# Patient Record
Sex: Male | Born: 1970 | Race: White | Hispanic: No | Marital: Married | Smoking: Never smoker
Health system: Southern US, Community
[De-identification: ages and names within clinical notes are randomized; demographics above are authoritative.]

## PROBLEM LIST (undated history)

## (undated) DIAGNOSIS — E119 Type 2 diabetes mellitus without complications: Secondary | ICD-10-CM

## (undated) DIAGNOSIS — G932 Benign intracranial hypertension: Secondary | ICD-10-CM

## (undated) DIAGNOSIS — Z87442 Personal history of urinary calculi: Secondary | ICD-10-CM

## (undated) DIAGNOSIS — I10 Essential (primary) hypertension: Secondary | ICD-10-CM

## (undated) DIAGNOSIS — G4730 Sleep apnea, unspecified: Secondary | ICD-10-CM

## (undated) DIAGNOSIS — M1990 Unspecified osteoarthritis, unspecified site: Secondary | ICD-10-CM

## (undated) DIAGNOSIS — Z8639 Personal history of other endocrine, nutritional and metabolic disease: Secondary | ICD-10-CM

## (undated) HISTORY — PX: CERVICAL SPINE SURGERY: SHX589

## (undated) HISTORY — PX: CSF SHUNT: SHX92

## (undated) HISTORY — PX: KNEE SURGERY: SHX244

## (undated) HISTORY — PX: LOOP RECORDER IMPLANT: SHX5954

---

## 1997-07-14 ENCOUNTER — Emergency Department (HOSPITAL_COMMUNITY): Admission: EM | Admit: 1997-07-14 | Discharge: 1997-07-14 | Payer: Self-pay | Admitting: Emergency Medicine

## 1998-02-12 ENCOUNTER — Ambulatory Visit (HOSPITAL_COMMUNITY): Admission: RE | Admit: 1998-02-12 | Discharge: 1998-02-12 | Payer: Self-pay | Admitting: Family Medicine

## 1998-02-12 ENCOUNTER — Encounter: Payer: Self-pay | Admitting: Family Medicine

## 1998-02-26 ENCOUNTER — Encounter: Payer: Self-pay | Admitting: Neurosurgery

## 1998-03-02 ENCOUNTER — Inpatient Hospital Stay (HOSPITAL_COMMUNITY): Admission: RE | Admit: 1998-03-02 | Discharge: 1998-03-03 | Payer: Self-pay | Admitting: Neurosurgery

## 1998-03-02 ENCOUNTER — Encounter: Payer: Self-pay | Admitting: Neurosurgery

## 1998-07-04 ENCOUNTER — Emergency Department (HOSPITAL_COMMUNITY): Admission: EM | Admit: 1998-07-04 | Discharge: 1998-07-04 | Payer: Self-pay | Admitting: Emergency Medicine

## 2001-09-05 ENCOUNTER — Encounter: Admission: RE | Admit: 2001-09-05 | Discharge: 2001-09-05 | Payer: Self-pay | Admitting: Family Medicine

## 2001-09-05 ENCOUNTER — Encounter: Payer: Self-pay | Admitting: Family Medicine

## 2003-11-12 ENCOUNTER — Emergency Department (HOSPITAL_COMMUNITY): Admission: EM | Admit: 2003-11-12 | Discharge: 2003-11-12 | Payer: Self-pay | Admitting: Emergency Medicine

## 2003-12-15 ENCOUNTER — Ambulatory Visit (HOSPITAL_COMMUNITY): Admission: RE | Admit: 2003-12-15 | Discharge: 2003-12-15 | Payer: Self-pay | Admitting: Family Medicine

## 2004-05-31 ENCOUNTER — Emergency Department (HOSPITAL_COMMUNITY): Admission: EM | Admit: 2004-05-31 | Discharge: 2004-05-31 | Payer: Self-pay | Admitting: Emergency Medicine

## 2004-06-07 ENCOUNTER — Ambulatory Visit: Admission: RE | Admit: 2004-06-07 | Discharge: 2004-06-07 | Payer: Self-pay | Admitting: Family Medicine

## 2004-07-01 ENCOUNTER — Encounter: Admission: RE | Admit: 2004-07-01 | Discharge: 2004-07-01 | Payer: Self-pay | Admitting: Neurology

## 2004-07-06 ENCOUNTER — Emergency Department (HOSPITAL_COMMUNITY): Admission: EM | Admit: 2004-07-06 | Discharge: 2004-07-07 | Payer: Self-pay | Admitting: Emergency Medicine

## 2005-08-14 ENCOUNTER — Encounter (INDEPENDENT_AMBULATORY_CARE_PROVIDER_SITE_OTHER): Payer: Self-pay | Admitting: Cardiology

## 2005-08-14 ENCOUNTER — Inpatient Hospital Stay (HOSPITAL_COMMUNITY): Admission: EM | Admit: 2005-08-14 | Discharge: 2005-08-18 | Payer: Self-pay | Admitting: Emergency Medicine

## 2005-09-14 ENCOUNTER — Encounter: Admission: RE | Admit: 2005-09-14 | Discharge: 2005-09-14 | Payer: Self-pay | Admitting: Gastroenterology

## 2005-09-19 ENCOUNTER — Encounter: Admission: RE | Admit: 2005-09-19 | Discharge: 2005-09-19 | Payer: Self-pay | Admitting: Gastroenterology

## 2005-11-02 ENCOUNTER — Ambulatory Visit (HOSPITAL_COMMUNITY): Admission: RE | Admit: 2005-11-02 | Discharge: 2005-11-02 | Payer: Self-pay | Admitting: Gastroenterology

## 2005-12-11 ENCOUNTER — Inpatient Hospital Stay (HOSPITAL_COMMUNITY): Admission: AD | Admit: 2005-12-11 | Discharge: 2005-12-14 | Payer: Self-pay | Admitting: *Deleted

## 2006-01-03 ENCOUNTER — Ambulatory Visit (HOSPITAL_COMMUNITY): Admission: RE | Admit: 2006-01-03 | Discharge: 2006-01-03 | Payer: Self-pay | Admitting: Neurology

## 2006-01-29 ENCOUNTER — Ambulatory Visit: Payer: Self-pay | Admitting: Endocrinology

## 2006-02-19 ENCOUNTER — Ambulatory Visit: Payer: Self-pay | Admitting: Endocrinology

## 2006-03-13 ENCOUNTER — Encounter: Admission: RE | Admit: 2006-03-13 | Discharge: 2006-03-13 | Payer: Self-pay | Admitting: Neurology

## 2006-03-26 ENCOUNTER — Encounter: Admission: RE | Admit: 2006-03-26 | Discharge: 2006-03-26 | Payer: Self-pay | Admitting: Neurology

## 2006-04-13 ENCOUNTER — Ambulatory Visit: Payer: Self-pay | Admitting: Endocrinology

## 2006-06-12 ENCOUNTER — Emergency Department (HOSPITAL_COMMUNITY): Admission: EM | Admit: 2006-06-12 | Discharge: 2006-06-12 | Payer: Self-pay | Admitting: Emergency Medicine

## 2006-06-20 ENCOUNTER — Ambulatory Visit (HOSPITAL_COMMUNITY): Admission: RE | Admit: 2006-06-20 | Discharge: 2006-06-20 | Payer: Self-pay | Admitting: Neurology

## 2006-11-03 ENCOUNTER — Encounter: Payer: Self-pay | Admitting: Endocrinology

## 2006-11-03 DIAGNOSIS — F3289 Other specified depressive episodes: Secondary | ICD-10-CM | POA: Insufficient documentation

## 2006-11-03 DIAGNOSIS — I10 Essential (primary) hypertension: Secondary | ICD-10-CM | POA: Insufficient documentation

## 2006-11-03 DIAGNOSIS — F329 Major depressive disorder, single episode, unspecified: Secondary | ICD-10-CM

## 2006-12-18 ENCOUNTER — Inpatient Hospital Stay (HOSPITAL_COMMUNITY): Admission: RE | Admit: 2006-12-18 | Discharge: 2006-12-20 | Payer: Self-pay | Admitting: Psychiatry

## 2006-12-18 ENCOUNTER — Emergency Department (HOSPITAL_COMMUNITY): Admission: EM | Admit: 2006-12-18 | Discharge: 2006-12-18 | Payer: Self-pay | Admitting: Emergency Medicine

## 2006-12-18 ENCOUNTER — Ambulatory Visit: Payer: Self-pay | Admitting: Psychiatry

## 2007-08-05 ENCOUNTER — Emergency Department (HOSPITAL_COMMUNITY): Admission: EM | Admit: 2007-08-05 | Discharge: 2007-08-05 | Payer: Self-pay | Admitting: Emergency Medicine

## 2007-10-22 ENCOUNTER — Emergency Department (HOSPITAL_BASED_OUTPATIENT_CLINIC_OR_DEPARTMENT_OTHER): Admission: EM | Admit: 2007-10-22 | Discharge: 2007-10-22 | Payer: Self-pay | Admitting: Emergency Medicine

## 2008-01-01 ENCOUNTER — Encounter: Admission: RE | Admit: 2008-01-01 | Discharge: 2008-01-01 | Payer: Self-pay | Admitting: Neurology

## 2008-02-04 ENCOUNTER — Encounter: Admission: RE | Admit: 2008-02-04 | Discharge: 2008-02-04 | Payer: Self-pay | Admitting: Neurology

## 2010-04-17 ENCOUNTER — Encounter: Payer: Self-pay | Admitting: Neurology

## 2010-08-09 NOTE — Discharge Summary (Signed)
NAME:  [name] [name], [name] [name]                 ACCOUNT NO.:  [id number]   MEDICAL RECORD NO.:  [id number]          PATIENT TYPE:  IPS   LOCATION:  0505                          FACILITY:  [facility]   PHYSICIAN:  Geoffery Lyons, M.D.      DATE OF BIRTH:  March 24, 1971   DATE OF ADMISSION:  12/18/2006  DATE OF DISCHARGE:  12/20/2006                               DISCHARGE SUMMARY   CHIEF COMPLAINT:  This was the first admission to [facility] for this 40 year old married white male voluntarily admitted.  History of substance use using opiates and benzodiazepines, taking 2  Xanax 3 times a day and opiates for a year.  Tried to elope.  He has  meds from PCP to help with detox.  Did not feel like he needed to be  here.   PAST MEDICAL HISTORY:  1. First time at [facility] and had been at [facility] before.  2. Clinical history as already stated, abusing opiates and      benzodiazepines.  3. Headaches.  4. Seizures.  5. Hypertension.   MEDICATIONS:  1. Tricor 145 mg per day.  2. Lotrel 10-40 one daily.  3. Celexa 40 mg per day.  4. Simvastatin 40 mg per day.  5. Bystolic 10 mg per day.  6. Lortab 2 tablets 3 times a day.  7. Adderall 30 mg twice a day.   Physical examination was performed and failed to show any acute  findings.   LABORATORY WORK:  Drug screen positive for benzodiazepines.  Other  results not available.   MENTAL STATUS EXAM:  Revealed a male, well-nourished, well-developed,  alert cooperative.  Good eye contact.  Speech clear, normal rate, tempo  and production.  Mood anxious.  Affect constricted.  Processes logical,  coherent and relevant.  No evidence of delusions.  No active suicidal or  homicidal ideas. No hallucinations.  Cognition well-preserved.   AXIS I: Opiate abuse, rule out dependence.  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 Rule out mood disorder, NOS.  AXIS II:  No diagnosis.  AXIS III:  Hypertension, seizure disorder.  AXIS IV: Moderate.  AXIS V:  On admission, 40 GAF in the last year 65.   COURSE IN THE HOSPITAL:  He was admitted.  He was started in individual  and group psychotherapy.  He was detoxified with clonidine and Librium.  He was given trazodone for sleep.  He was given some Seroquel.  Claimed  that he had a real bad headache.  Seizures to pain medications.  Says  that the dose that he was given did not work.  He had he took more of  it, took his months prescription in a week.  Then he has to get some  from the street.  Seen a neurologist in [facility] for his headaches.  Had been on Depakote, Topamax, Neurontin.  Working at [employer], and has been on disability on and off for a year.  Has suffered  depression before and was separated for a year.  He did not consider to  have a  problem.   He left the ED yesterday.  The evening before this admission, he was  threatened that DSS was going to be notified as he was driving the  children to school under the influence.  After he eloped, wife brought  him back and he has voluntary admission.  He had been in [facility]  before.  Initially, he apparently was calling his wife, stating that he  was not going to stay because he wanted pain medications.  The wife did  not want him back unless he was fully detoxed.  He endorsed that he did  not want to go through cold [place].  That first evening, he became  increasingly belligerent and demanding pain medications, and given  Seroquel. Was able to run through door to the hall when the staff coming  through.  He was talked into coming back.  He was pacing.  Focused that  he wanted to get out of the hospital.  He required a lot of staff  intervention to de-escalate.  He did decide 72 hours for discharge, but  he had a whole lot different attitude.  He was pretty sedated with the  Seroquel which allowed some of the detox to happen.  He still did not  want to comply.   He was willing to take Depakote.  We worked on  optimizing treatment with  the Depakote.  A lot of the mood fluctuation was secondary to his use.  On December 20, 2006, he was in full contact with reality.  There were  no active suicidal ideas, no hallucinations or delusions.  Claimed that  he was not going to go back to use drugs, opiates.  Notes that he came  back to break up the cycle.  We will allow the physician to reassess his  pain management.  He is going to have less availability as the wife was  going to take the pills, so we went ahead and discharged to outpatient  followup.   AXIS I:  Opiate benzodiazepine dependence.  Mood disorder, NOS.  AXIS II: No diagnosis.  AXIS III:  Hypertension, seizure disorder, migraine headache.  AXIS IV:  Moderate.  AXIS V:  On discharge 50.   DISCHARGE MEDICATIONS:  1. Lotrel 10-40 one in the morning.  2. Tricor 145 mg per day.  3. Celexa 40 mg in the morning.  4. Simvastatin 40 mg per day.  5. Bystolic 10 mg 1 at bedtime.  6. Depakote 250 twice a day.   FOLLOW UP:  Follow up with his neurologist, and [name] [name].      Geoffery Lyons, M.D.  Electronically Signed     IL/MEDQ  D:  01/09/2007  T:  01/10/2007  Job:  [id number]

## 2010-08-12 NOTE — Consult Note (Signed)
[facility]                            ENDOCRINOLOGY CONSULTATION   NAME:[name] [name], [name] [name]                        MRN:          [id number]  DATE:01/29/2006                            DOB:          1970/08/01    REFERRING PHYSICIAN:  Melida Quitter, M.D.   REASON FOR REFERRAL:  Hypogonadism.   HISTORY OF PRESENT ILLNESS:  A 40 year old man with 2 years of fatigue.  He  has associated erectile dysfunction as well as weakness of the muscles of  all four extremities.  He also has chronic headache which he states he has  been told is due to a spinal origin.  He is being scheduled to see a  neurologist at [facility] for this.  He was prescribed AndroGel 10 g daily  without any improvement in his symptoms nor in his laboratory studies.   PAST MEDICAL HISTORY:  1. Hypertension.  2. Dyslipidemia.  3. ADHD.  4. Depression.   SOCIAL HISTORY:  He is married.  He works [employer].  He  has two children, one of whom is adopted.  The adopted child is 71 years old  and his biological child is 9 years old.   REVIEW OF SYSTEMS:  He has gained 30 pounds in the past 2 years.  He denies  any difficulty starting or stopping the urinary stream.   PHYSICAL EXAMINATION:  VITAL SIGNS:  Blood pressure 140/92, heart rate is  86, temperature is 97.2, the weight is 206, height is 5 feet 6 inches.  GENERAL:  No distress.  SKIN:  Normal hair distribution.  HEENT:  No proptosis, no periorbital swelling.  NECK:  No goiter.  CHEST:  Clear to auscultation.  There is no gynecomastia, no respiratory  distress.  CARDIOVASCULAR:  No JVD, no edema.  Regular rate and rhythm, no murmur.  Pedal pulses are intact.  GENITALIA:  Normal except that the testes are both small and soft.   LABORATORY STUDIES FORWARDED BY DR. HARRIS:  On December 15, 2005,  testosterone 1.2 ng/mL, which is low.  On December 15, 2005, he has an  undetectable growth hormone, a  random cortisol of 4.7 mcg/dL, a prolactin of  40.9 ng/mL.  On September 20, 2005, a testosterone is 0.6 ng/mL.  On Aug 10, 2005, HDL cholesterol 40, triglycerides 415, LDL 122.  On May 23, 2005,  TSH 1.28, testosterone 1.6 ng/mL.  MRI of the brain done for an unrelated  reason on June 14, 2005, incidentally notes the pituitary normal except it  appears to be small.   IMPRESSION:  1. Hypogonadism of uncertain etiology.  It is unclear why his levels have      not improved on AndroGel.  2. Erectile dysfunction, probably due to #1.  3. Dyslipidemia, which could worsen with normalization of his      testosterone.  4. Weight gain which may be also suppressing his testosterone.  5. Headache which is not related.  6. Abnormal MRI of the pituitary.  It is uncertain what the relationship      of this  is to his hypogonadism.   PLAN:  1. We discussed the causes and treatment of hypogonadism.  For now, he      will change from the AndroGel to Clomid 12.5 mg (one-fourth of a 50-mg      tablet) daily.  2. Return in about 3 weeks, when he will be due for a recheck of his      testosterone I would plan to do an ACTH stimulation test as well.  3. I have warned him that the Clomid could also increase his fertility and      he needs to take precautions in this regard if he does not want      fertility now.    ______________________________  Cleophas Dunker. Everardo All, MD    SAE/MedQ  DD: 01/29/2006  DT: 01/29/2006  Job #: [id number]   cc:   Melida Quitter, M.D.

## 2010-08-12 NOTE — Procedures (Signed)
REQUESTING PHYSICIAN:  Dr. Kelli Hope   ATTENDING PHYSICIAN:  Dr. Sherin Quarry   EEG NUMBER:  [id number]   CLINICAL HISTORY:  This is a routine EEG done with photic stimulation and  hyperventilation.  The patient is described as awake and asleep.  A 40-year-  old male being evaluated for syncopal episodes.  EEG is performed for  evaluation of possible seizure.   DESCRIPTION:  The dominant rhythm of this tracing is a moderate to high  amplitude alpha rhythm of 10-11 Hz which predominates posteriorly, appears  without abnormal asymmetry and attenuates with eye opening and closing.  Low  amplitude fast activity is seen frontally and centrally and appears without  abnormal asymmetry.  No focal slowing is noted, no epileptiform discharges  are seen.  Drowsiness occurs naturally as evidenced by fragmentation of the  background and generalized attenuation of rhythms.  Stage 2 sleep is  achieved and findings of stage 2 sleep including vertex waves, sleep  spindles, and K complexes are seen.  No abnormalities are seen in the sleep  state.  Photic stimulation produced symmetric driving responses.  Hyperventilation produced no significant change in the background rhythms.  Single channel devoted to EKG reveals sinus rhythm throughout with a rate of  approximately 60 beats per minute.   CONCLUSIONS:  Normal study in the awake, drowsy, and sleep states.      Michael L. Thad Ranger, M.D.  Electronically Signed     WJX:BJYN  D:  08/14/2005 21:38:35  T:  08/15/2005 12:05:23  Job #:  [id number]

## 2010-08-12 NOTE — Consult Note (Signed)
NAME:  [name] [name], [name] [name]                 ACCOUNT NO.:  [id number]   MEDICAL RECORD NO.:  [id number]          PATIENT TYPE:  INP   LOCATION:  3731                         FACILITY:  [facility]   PHYSICIAN:  Francisca December, M.D.  DATE OF BIRTH:  Jun 10, 1970   DATE OF CONSULTATION:  08/15/2005  DATE OF DISCHARGE:                                   CONSULTATION   REASON FOR CONSULTATION:  Syncope.   HISTORY OF PRESENT ILLNESS:  [name] 40 year old Caucasian male with a  history hypertension, hypercholesterolemia and recurrent syncope.  He had at  least four syncopal episodes in the past, the last being about a year ago.  The most recent was 2 days ago.  He was in his usual state of health while  at work until after lunch  when he began feeling nauseous and vomited x3,  developed a headache, left work began driving home.  After being in the car  about 20 minutes he began to feel diaphoretic with recurrent nausea and had  scintillating scotoma visual disturbances.  He pulled off the road and does  not recall anything from there  until he was in the car with his brother en  route to [facility].  However, other sources tell us that he called his  wife and told her he was not able to drive home, was unsure of his location  but he was able to describe the surroundings adequate for her to drive and  find him.  She took them to the emergency room at [facility] who recommended he be  transferred to [facility] which was done by private automobile.  Since arriving at  [facility] he has had numerous studies done including rule out myocardial  infarction and echocardiogram, carotid Doppler's.  These all been  unrevealing were normal.  He has been seen by the neurologist who feels that  the etiology of his syncope is uncertain but thought that is seizure. He is  concerned about atypical migraine and symptoms associated that.  An MRI is  pending.   Previous syncopal episodes have been similar.  He generally has  very little  recollection of these.  He has been passed out on the floor by his brother  x4 who lived with him for about a year.   PAST MEDICAL HISTORY:  1.  Hypertension.  2.  Hypercholesterolemia.  3.  History low serum testosterone.  4.  History of chronic headaches, questionable migraine.  5.  A history of ADHD.   ALLERGIES:  None known drug allergies.   MEDICATIONS:  TriCor 145 mg daily, Zocor (unknown dose) one daily. AndroGel  applied daily. Concerta (unknown dose daily) one daily. Lotrel (unknown  dose) one daily.   FAMILY HISTORY:  Mother is alive, has hypertension, diabetes and 1 unknown  type of cancer.  His maternal grandfather had bypass in his 62s.  His  maternal grandfather has diabetes. Four brothers one of whom has  hypertension. family history is significant for migraine headaches.   SOCIAL HISTORY:  He is married with one son. Lives with his wife and  son. No  tobacco or ETOH or illicit drug use.   REVIEW OF SYSTEMS:  He has had frequent loose stools without any blood since  being in the hospital.  Did have the emesis prior to his driving home from  work that day.   PHYSICAL EXAMINATION:  GENERAL:  This 40 year old pleasant, cooperative  Caucasian man no distress. Well-kept.  VITAL SIGNS:  Blood pressure is 95/59, pulse is 70, respiratory are 12,  temperature 98.2, O2 saturation on room air 97%.  HEENT:  Head is atraumatic, normocephalic.  Pupils equal and react to light  accommodation.  Extraocular motion intact.  Sclerae are anicteric.  Oral  mucosa pink and moist.  Teeth and gums in good repair.  Tongue is not  coated.  NECK:  The neck is supple without thyromegaly or masses. The carotid  upstrokes are normal.  There is no bruit is noted, no venous distension.  CHEST: His chest is clear with adequate excursion bilaterally.  No wheezes,  rales or rhonchi.  HEART:  Has a regular rhythm, normal S1 and S2 is heard.  No S3-S4 murmur,  click or rub noted.   ABDOMEN:  Soft, flat, nontender, no hepatosplenomegaly  or midline pulsatile masses.  Bowel sounds present all quadrants.  EXTERNAL GENITALIA:  Normal male phallus, descended testicles.  No lesions.  RECTAL:  Not performed.  EXTREMITIES:  Show full show full range of motion.  No edema.  Intact distal  pulses.  NEUROLOGICAL:  Intact distal pulses.  Motor and sensory grossly  intact.  Gait not tested.  Skin is warm, dry and clear.   ACCESSORY CLINICAL DATA:  Admission hemogram, serum electrolytes, BUN,  creatinine, glucose and liver associated enzymes normal.  TSH is 0.731,  total cholesterol 187, LDL 116, triglyceride 214.  2D echocardiogram  LV  systolic function lower limit of normal, ejection fraction 50-55% without  regional wall motion abnormality. No other findings.  EKG normal sinus  rhythm, normal EKG.   IMPRESSION:  Syncopal episodes but apparently quite prolonged with a fair  degree of amnesia associated with this.  Seems unlikely for cardiac, given  the length of time the patient is symptomatic.  Apparently when his wife  found him though he was unconscious over the steering wheel and drooling.  Cardiac etiologies to be considered at this point are prolonged bradycardia,  vasovagal or vasodepressor syncope, coronary ischemia, all of which seem  quite unlikely.  1.  Hypertriglyceridemia.  2.  Hypertension.  3.  Chronic headaches (question) is this some form of atypical migraine.  4.  Fatty liver by a recent ultrasound.  5.  Low testosterone.  6.  ADHD.   PLAN:  1.  Agree with your workup thus far.  2.  Will obtain a stress Cardiolite, rule out of ischemia, n.p.o. after      midnight.  3.  If no findings on the Cardiolite will schedule a tilt-table-test with      Dr. Carolanne Grumbling  4.  Continue telemetry monitoring while hospitalized and also consider an     event monitor or implantable loop recorder if the above studies are      negative and station.      Francisca December, M.D.  Electronically Signed     JHE/MEDQ  D:  08/15/2005  T:  08/16/2005  Job:  [id number]   cc:   Holley Bouche, M.D.  Fax: [phone]   Marolyn Hammock. Thad Ranger, M.D.  Fax: [phone]

## 2010-08-12 NOTE — Op Note (Signed)
NAME:  [name] [name], [name] [name]                 ACCOUNT NO.:  [id number]   MEDICAL RECORD NO.:  [id number]          PATIENT TYPE:  INP   LOCATION:  3738                         FACILITY:  [facility]   PHYSICIAN:  Armanda Magic, M.D.     DATE OF BIRTH:  1970/04/10   DATE OF PROCEDURE:  08/17/2005  DATE OF DISCHARGE:                                 OPERATIVE REPORT   PROCEDURE:  Tilt table test.   OPERATOR:  Armanda Magic, M.D.   INDICATIONS:  Syncope.   COMPLICATIONS:  None.   IV MEDICATIONS:  Isuprel drip.   REFERRING PHYSICIAN:  Dr. Corliss Marcus and Dr. Sherin Quarry.   HISTORY:  This is a very pleasant 40 year old white male who presents to the  emergency room with a past history of several syncopal episodes, the most  recent one being this past Sunday, when he was driving.  Apparently he had  significant amnesia after the syncopal episode.  Stress Cardiolite study  showed no inducible ischemia.  He now presents for tilt table testing.   DESCRIPTION OF PROCEDURE:  The patient was brought to the cardiac  catheterization laboratory in the fasting nonsedated state.  Informed  consent was obtained.  The patient was connected to continuous heart rate  and pulse oximetry monitoring; and intermittent blood pressure monitoring.  The patient's blood pressure was measured supine for 5 minutes.  Baseline  blood pressure was 120-130/61-70 mmHg with heart rates in the 60s.  The  patient was then tilted upright to 70 degrees for a total of 30 minutes with  no significant change in blood pressure or heart rate.  The patient was then  placed supine, again, when Isuprel was started at 7.5 mL/h.; and titrated up  to a high as 15 mL/h. to obtain a 20% increase in baseline heart rate.  The  patient was subsequently tilted back up to 70 degrees, again.  Lowest blood  pressure achieved during upright tilt on no medication was 115/68 mmHg with  a pulse of 70.  Lowest heart rate achieved during upright tilt on  Isuprel  was 121/87 mmHg with heart rates in the 90s.  The patient did feel somewhat  nauseated during the upright tilt with Isuprel, but the heart rate, at one  point, was up to 120 beats per minute.  At the end of procedure the patient  was placed supine; Isuprel drip was stopped; and patient was transferred  back to his room in stable condition.   RESULTS:  1.  History of syncope of unknown etiology.  2.  Negative tilt table test for syncope.   PLAN:  Further workup with Dr. Corliss Marcus.      Armanda Magic, M.D.  Electronically Signed     TT/MEDQ  D:  08/17/2005  T:  08/18/2005  Job:  [id number]   cc:   Francisca December, M.D.  Fax: [phone]   Sherin Quarry, MD

## 2010-08-12 NOTE — Consult Note (Signed)
NAME:  [place], [place]                 ACCOUNT NO.:  [id number]   MEDICAL RECORD NO.:  [id number]          PATIENT TYPE:  INP   LOCATION:  3731                         FACILITY:  [facility]   PHYSICIAN:  Casimiro Needle L. Reynolds, M.D.DATE OF BIRTH:  [birth year]   DATE OF CONSULTATION:  DATE OF DISCHARGE:                                   CONSULTATION   DATE OF EVALUATION:  Aug 14, 2005.   REASON FOR EVALUATION:  Syncope.   HISTORY OF PRESENT ILLNESS:  This is an inpatient consultation/evaluation of  an existing [facility] patient, a 40 year old man seen  on a single occasion in our office by Dr. Orlin Hilding in April of last year,  which had been referred for headache, blurred vision, and syncope.  According to her notes, he had had posterior headaches, which were fairly  frequent and severe.  He had had a syncopal episode about a month prior to  the evaluation, the etiology of which was uncertain.  At that time, he had a  normal neurological examination.  Subsequent workup included MRI of the  brain with MRA to the intracranial and extracranial circulation, all of  which were normal, and he was not seen in followup apparently.  He  subsequently reported to Dr. Orlin Hilding that his problems were better possibly  as a result of treatment of his hypertension.  He was hospitalized for a  syncopal event, which happened yesterday.  The patient said that he was at  work when he began to feel nauseated.  He said that this is not an uncommon  thing for him.  He got into his car and was driving home.  While driving  home, he began to see spots and began to feel lightheaded as if he were  going to pass.  He recalls getting his car off the road but does not recall  anything after that until he was in the emergency room.  He apparently was  able to call his wife, who went to where he was and found him passed out  in his car.  He also apparently had a brief loss of consciousness at the  emergency  room in [place], which is where he was evaluated.  He was then  brought to [facility] for further evaluation and workup.  The  patient says that the episode he had a year ago was similar in that he had a  warning of a presyncopal sensation without any focal abnormalities or  sensations, but he does not recall being nauseated at the time.  He does  recall that with both events he had a fairly severe headache.  With regards  to his headaches, he does get them fairly frequently.  He says that he has a  headache three or four days a week, and they are severe about one day a  week.  He says they are typically located in the back of the head and are  more of a constant sensation than a throbbing character.  They will  typically last hours at a time.  When they are severe,  he tends to have  phonophobia and sometimes photophobia as well.  He is not sure that he  reliably gets nauseated with the headaches.  He takes a pain medication,  which was prescribed for his back, when he has a severe headache and feels  this works fairly well.  For his milder headaches, he takes over-the-counter  medications, mostly Tylenol, which he does feel is helpful.  He does think  that he might have had a few passing out episodes between a year ago and  now, but he does not have much in terms of details about this.   PAST MEDICAL HISTORY:  As above.  He states that over the past year or so,  he was found to have:  1.  Hypertension.  2.  Hypercholesterolemia.  3.  Hypertriglyceridemia.  4.  Low testosterone levels.  He has been on testosterone replacement for a      couple of months.  5.  History of ADHD and thinks that he was started on Concerta on Thursday,      three days prior to admission, by his primary physician.  6.  An episode in his teenage years when he passed out.  He does not recall      much about that but does recall having an EEG, which had an abnormal      spike and subsequently a repeat EEG  performed with 24-hour sleep      deprivation, which apparently was normal.   FAMILY HISTORY:  Positive for ADHD in his son, he had migraine in his  grandmother, also remarkable for a stroke, hypertension,  hypercholesterolemia, diabetes, and thyroid disease.   SOCIAL HISTORY:  He denies recreational drug use.  He is separated from his  wife.   MEDICATIONS PRIOR TO ADMISSION:  He had just started Concerta and was also  taking Zocor, Tricor, and AndroGel.  In the hospital, the Concerta has been  held, and Lotrel has been added.   PHYSICAL EXAMINATION:  VITAL SIGNS:  Temperature 97, blood pressure 122/74,  pulse 64, respirations 20, O2 SAT 96% on room air.  GENERAL:  This is a healthy-appearing man, who seems in no distress.  HEAD:  Cranium is normocephalic and atraumatic.  Oropharynx benign.  NECK:  Supple without carotid or supraclavicular bruits.  HEART:  Regular rate and rhythm without murmurs.  NEUROLOGIC EXAM:  Mental status:  He is awake, alert, fully oriented to  time, place, and person.  Recent memory and remote memory are intact.  Attention span, concentration, and fund of knowledge are all appropriate.  Speech is fluent and not dysarthric.  He has no defense to confrontational  naming.  Mood is euthymic, and affect appropriate.  Cranial nerves:  Funduscopic exam is benign.  Pupils are equal and briskly reactive.  Extraocular movements are full without nystagmus.  Visual fields are full to  confrontation.  Hearing is intact to conversational speech.  Facial  sensation is intact.  Face, tongue, and palate move normally and  symmetrically.  Shoulder shrug strength is normal.  Motor testing:  Normal  bulk and tone, normal strength in all extensor extremity muscles, sensation  intact to light touch in all extremities.  Coordination:  Rapid alternating  movements are performed well, finger-to-nose and heel-to-shin are performed well.  Gait:  He arises easily from a chair, and his  stance is normal.  He  is able to walk normally and tandem walk without difficulty.  Reflexes 2+  and symmetric, toes  are downgoing bilaterally.   LABORATORY REVIEW:  He had a normal EKG this morning.  He had normal labs at  [facility] yesterday including a normal CBC.  Chemistry was  remarkable for an elevated glucose of 130, otherwise normal.  Negative  urinalysis.  Negative urine drug screen curiously absent for evidence of  amphetamines.  He has not had neuro imaging.   IMPRESSION:  1.  Syncope.  The etiology is uncertain but doubt seizure.  2.  Headaches.  Would agree that these are not typical for migraines but      still might be, and migraine might offer another explanation for some of      his other paroxysmal symptoms including nausea and syncope.  3.  History of attention deficit hyperactivity disorder (ADHD).  Although he      just started Concerta when this had happened, this is unlikely to be      causative.  4.  Hypertension currently controlled in the hospital.   PLAN:  We will repeat an MRI of the brain, although it was normal in 2006.  We will also check an EEG.  The hospitalists are proceeding with a  cardiovascular workup.  If all of this is negative as it is likely to be, I  would suggest empiric treatment for migraine with valproate, topiramate, and  possibly Verapamil.   Thank you for the consultation.      Michael L. Thad Ranger, M.D.  Electronically Signed     MLR/MEDQ  D:  08/14/2005  T:  08/14/2005  Job:  [id number]

## 2010-08-12 NOTE — Consult Note (Signed)
[facility]                          ENDOCRINOLOGY CONSULTATION   NAME:[name], [name]                        MRN:          [id number]  DATE:02/19/2006                            DOB:          02/01/1971    REASON FOR VISIT:  Followup hypogonadism.   HISTORY OF PRESENT ILLNESS:  This is a 40 year old man who states he  continues to have erectile dysfunction and fatigue which have not been  improved with Clomid 12.5 mg a day.   PAST MEDICAL HISTORY:  He took steroids at a health club for from age 21  to 46, but has not taken them in recent years.   REVIEW OF SYSTEMS:  Denied any change in his weight.   PHYSICAL EXAMINATION:  VITAL SIGNS: Blood pressure 132/78, heart rate  70, temperature 97.1, weight 210.  GENERAL:  In no distress.  NEUROLOGIC:  He appears minimally depressed.   LABORATORY DATA:  Testosterone 163.5.  He also has an [facility] stimulation  test.  Baseline cortisol is 9.1 mcg/dl.  He then gets an inframuscular  injection of 250 mcg of cosyntropin. A repeat cortisol 45 minutes later  is 26.   IMPRESSION:  1. Hypogonadism without a response to Clomid so far.  2. No evidence of any other pituitary insufficiency.   PLAN:  1. Increase Clomid to 50 mg daily.  2. Return in four to six weeks.     Sean A. Everardo All, MD  Electronically Signed    SAE/MedQ  DD: 02/21/2006  DT: 02/22/2006  Job #: [id number]   cc:   Gennaro Africa, M.D.

## 2010-08-12 NOTE — H&P (Signed)
NAME:  [name] [name]                 ACCOUNT NO.:  [id number]   MEDICAL RECORD NO.:  [id number]          PATIENT TYPE:  EMS   LOCATION:  MAJO                         FACILITY:  [facility]   PHYSICIAN:  Michelene Gardener, MD    DATE OF BIRTH:  [birth year]   DATE OF ADMISSION:  08/13/2005  DATE OF DISCHARGE:                                HISTORY & PHYSICAL   PRIMARY CARE PHYSICIAN:  The patient  in [place].  The patient unassigned.   CHIEF COMPLAINT:  Passing out today.   HISTORY OF PRESENT ILLNESS:  This is a 40 year old Caucasian male.  Past  medical history of recurrent syncope and hypertension who presented with the  above-mentioned complaints.  The patient stated that in the past one or two  years, he had too many syncopal episodes.  He did not remember too much  about the syncopal episodes when he was found unconscious many times by his  brother and by his wife.  His brother is present at the time of this  interview and he states that he found the patient unconscious and passing  out a history of four times.  He did not notice any shaking of his body at  that time.  There is no tongue-biting and there is no incontinence.  Today  the patient was driving when he started feeling a little dizzy.  He called  his wife and he does not remember exactly what happened after that. When he  woke up, he found himself with his wife going to the emergency room.  As per  him,  his wife stated that he was talking to her on the phone and then he  stated that he does not know where he is and he does not know exactly what  is going on.  And then he blacked out.  He does not know for how long he  passed out.  He denies any chest pain.  No shortness of breath.  There is no  shaking of his body and no seizure activity.  No tongue-biting, no urinary  or bowel incontinence.  The patient was seen at [facility] where he was  advised to go to [facility] for further evaluation.  In [facility]  he  refused to be seen by ER physician because he said this was  already done at [facility].   PAST MEDICAL HISTORY:  1.  History of recurrent syncopal attacks.  2.  Hypertension.  3.  Hypercholesterolemia.  4.  History of low testosterone.  5.  History of headache.  6.  Kidney  cyst.   PAST SURGICAL HISTORY:  Denied.   MEDICATIONS:  1.  Tricor 145 mg  p.o. once daily.  2.  Zocor unknown dose.  3.  AndroGel  unknown dose.  4.  Concerta unknown dose.  5.  Lotrel  unknown dose.   ALLERGIES:  No known drug allergies.   SOCIAL HISTORY:  The patient denied smoking, denies alcohol drinking and  denies recreational drugs.   FAMILY HISTORY:  Significant for diabetes mellitus in his  mother, his  grandmother and his uncle.  Also positive for hypertension in his mother and  his brother and there is cancer in his mother.   REVIEW OF SYSTEMS:  Positive for syncopal episodes and headache.  Constitutional:  There is no fever, no weakness, no weight changes  .  Eyes:  No problems with vision, no pain, no redness.  ENT:  No ear pain, no hearing  loss, no discharge .  No sinus, no difficulty swallowing.  RESPIRATORY:  No  cough, no wheeze, no hemoptysis.  No dyspnea.  CARDIOVASCULAR:  No chest  pain, no orthopnea, no edema.  No palpitations.  Marland Kitchen  GI:  No nausea, no  vomiting, no diarrhea, no abdominal pain, no constipation.  GU:  No dysuria,  no hematuria, frequency.  ENDOCRINE:  No diarrhea, no nocturia, no sweating.  HEMATOLOGY:  No bruises, no bleeding.  INFECTIOUS DISEASE: No rash, no  lesions.  NEUROLOGIC: No numbness, no  tingling, no weakness, no tremors ,  no tremors.  No more seizures  and is positive for headaches.  The rest of  the systems were reviewed and they were negative.   PHYSICAL EXAMINATION:  VITAL SIGNS: Temperature is 97.4 ,  blood pressure  133/91, Pulse 84 , respiratory rate 25.  GENERAL APPEARANCE:  This is a young Caucasian male with no acute distress  at this  present time.  HEENT: Conjunctiva normal no pallor and no erythema  .  Pupils are equal,  round and reactive to light and accommodation.  There is no ptosis.  Hearing  is intact.  There is no ear discharge, no infection and no  bleeding .  Oral  mucosa is moist and there is no frank  erythema.  NECK:  Supple. No JVD.  No carotid bruits.  no lymphadenopathy .  CARDIOVASCULAR:  S1 and S2 were heared, no additional heart sounds.  There  are no murmurs , no gallops, no thrills .  RESPIRATORY:  The patient is breathing between 16 and 18. There is no use of  accessory muscles.  No intercostal retractions.  No dullness.  No rales.  No rhonchi.  No wheezes.  ABDOMEN:  Soft, nondistended.  There is mild tenderness which is diffuse and  it was chronic.  That was to deep palpation.  No hepatosplenomegaly.  Bowel  sounds are normal.  EXTREMITIES:  No edema, no rash, no varicosities.  SKIN:  No rash, no erythema.  No ulcers.  Skin is warm to touch.  NEUROLOGIC:  Cranial nerves are intact from 2 to 12 .  There is no motor or  sensory deficits.  Strength is 5/5 in all four extremities.  Reflexes are  2/2 in all major reflexes.  Negative Babinski's sign.  Sensation is normal  to pain and thought decisions.  PSYCHIATRIC:  Alert and oriented x3.  He is anxious about his current  medical condition.  No recent or remote memory impairment .   LABORATORY DATA:  They did not do any labs in the hospital in here but he  had labs the same day at [facility].  The results are as  follows:  Urine toxicology is negative.  His urinalysis is negative.  Glucose is 130.  BUN 11, creatinine 1.1.  Calcium 9.7, sodium 143, potassium  3.5, chloride 110, bicarb 27.  White count 6.3, hemoglobin 13.5, hematocrit  39.5, MCV 87.6, platelet count 246.  EKG showed sinus rhythm at 90 beats per  minute.  There is  no evidence of ischemia.  There are ST segment  abnormalities and the T wave  abnormalities.  IMPRESSION:  This is a 40 year old male, past medical history of  hypertension and recurrent syncope.  Presented with syncopal episodes.   ASSESSMENT/PLAN:  Problem #1.  Recurrent syncope.  :Lately,  this patient  has too many syncopal episodes in the last one or two years.  There is no  work-up done for that.  The last syncopal episode was today.  We will admit  this patient to telemetry monitor.  We will get echocardiogram to assess his  left ventricular function, ejection fraction, to see if there is any  valvular  abnormalities.  Will also get carotid Dopplers to rule out any  carotid stenosis.  Because the patient has been having recurrent syncope, we  will get a cardiology consultation to assess him for tilt table testing and  he might need Holter monitor as well.  I will also get neurology  consultation for him for further evaluation and possible EEG to rule out any  underlying seizure activity although his history does not fit with seizures.   Problem #2.  Hypertension. The pt normally checks his blood pressure every  week.  It has been  basically in the range of 130-140.  He will continue his  current medication and we will monitor his blood pressure.   Problem #3.  Hypercholesterolemia.  Will continue his current medications  and get lipid profile.   Problem #4.  Depression.  I will hold his Concerta  because it is known to  cause arrhythmia.  I would hope that  any underlying arrhythmias are ruled  out.   Admission time 50 minutes.      Michelene Gardener, MD  Electronically Signed     NAE/MEDQ  D:  08/14/2005  T:  08/14/2005  Job:  [id number]

## 2010-08-12 NOTE — Procedures (Signed)
EEG NUMBER:   HISTORY:  This is a 40 year old with spells who is having continuous video  EEG monitoring done to evaluate for diagnosis of epileptic versus  nonepileptic events.   PROCEDURE:  This is a continuous video EEG monitoring.   TECHNICAL DESCRIPTION:  Continuous video EEG monitoring was performed from  December 11, 2005 to December 14, 2005.  Each day's tracings were reviewed  and push button events were reviewed as well.   On the first day of tracing from December 11, 2005 to December 12, 2005  there are no push button or typical events noticed.  The background of this  tracing was fairly symmetric and mostly comprised of alpha range activity at  10-30 microvolts.  Throughout this entire tracing there is no definitive  epileptiform activity noted.   On day two of tracing from December 12, 2005 to December 13, 2005 there  were five push button events noticed.  The first push button event occurs at  3:05 p.m.  Clinically the patient is working out on the side of bed, lays  down in bed, he rolls over on his right side facing away from the camera,  extends his body, primarily his arms, to a rigid posture.  Electrographically there is diffuse EMG movement artifact noticed throughout  the entire tracing.  There is no definitive epileptiform activity.  The  entire episode lasts about 30-40 seconds in duration and after the episode  the background activity is normal without any slowing.  The second episode  or push button event occurs at 3:56 p.m.  This episode consists of the  patient lying in bed where he has some trembling and rigidity in his arm  reflection of his hands and also flexion of his feet.  The patient is  unresponsive during this time as he has his upper extremities extended.  There is no convulsions noted during this time frame.  Throughout this  tracing there is a tremendous amount of EMG movement artifact, there is no  change in the overall background and  there is no definitive epileptiform  activity noted.  The third push button event occurs at 3:59 p.m.  This event  is similar to the second event and is mostly with the patient lying down in  bed looking as they are asleep, it seems that the patient extends his upper  extremities, becomes rigid with flexion of his feet.  Throughout this  recording there is no definitive epileptiform activity or electrographic  seizure noted.  The fourth push button event occurs at 4:05 p.m.  The  patient is also lying in bed being unresponsive, is trembling with flexion  of the ankles and extension of the upper extremities, this is a brief  episode.  The background consists of no change, primarily alpha range  activity with some EMG artifact.  There is no electrographic seizures or  interictal discharges noticed during this time frame.  The fifth push button  event occurs at 5:03 p.m. and consists of the patient lying down in bed  awake, there is no abnormal movements and the patient seems to be  responsive, the background is fairly unchanged in the normal background,  there is no definitive epileptiform activity noticed during this time frame.  Overall background of this tracing is symmetric and mostly comprised of  alpha range activity with occasional theta range activity at 10-40  microvolts.   The third day of tracing from December 13, 2005 to December 14, 2005 there  are  no push button events noted.  The background of this tracing is primary  symmetric and mostly comprised of alpha range activity between 15 and 40  microvolts.  Throughout this recording there is no definitive epileptiform  activity noted.   IMPRESSION:  This continuous video EEG monitoring performed from December 11, 2005 to December 14, 2005 had five push button events which were the  patient's typical events.  Throughout these typical spells there is no  definitive epileptiform activity noted and mostly EMG/movement  artifact.  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  I have  discussed the findings with Dr. Orlin Hilding who referred the patient for video  monitoring.      Bevelyn Buckles. Nash Shearer, M.D.  Electronically Signed     ZOX:WRUE  D:  12/14/2005 12:05:19  T:  12/15/2005 22:28:29  Job #:  [id number]   cc:   Santina Evans A. Orlin Hilding, M.D.  Fax: [phone]

## 2010-08-12 NOTE — Consult Note (Signed)
[facility]                          ENDOCRINOLOGY CONSULTATION   NAME:[name] [name]                        MRN:          [id number]  DATE:04/13/2006                            DOB:          November 13, 1970    REASON FOR VISIT:  Follow-up hypogonadism.   HISTORY OF PRESENT ILLNESS:  This is a 40 year old man who is now taking  his Clomid 50 mg daily and states he feels no different.   PAST MEDICAL HISTORY:  Same as February 19, 2006.   REVIEW OF SYSTEMS:  Denies any change in his weight.   PHYSICAL EXAMINATION:  VITAL SIGNS:  Blood pressure is 127/85, heart  rate 70, temperature is 98.6, the weight is 216.  GENERAL:  No distress.  Muscle, bulk, and strength appear to be normal.   LABORATORY DATA:  Laboratory studies forwarded by Dr. Tiburcio Pea:  On  April 09, 2006, total testosterone 276 ng/dl, free testosterone is 6.9  pg/ml which is slightly low.   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   PLAN:  1. As he states he has not had any response to AndroGel in the past, I      advised him to take Testin topically.  He says that he is very      frustrated in general with his low testosterone and wants to resume      injections.  I have told him this is fine, but it is a painful and      inconvenient form of therapy.  Still, he states his wife is a Engineer, civil (consulting)      and she will administer it to him.  He will start taking      testosterone in oil 200 mg intramuscular every two weeks and he      will return here for a trough level prior to his third or fourth      dose.  2. If this is normal, he can return in a year.     Sean A. Everardo All, MD  Electronically Signed    SAE/MedQ  DD: 04/15/2006  DT: 04/15/2006  Job #: [id number]   cc:   Holley Bouche, M.D.

## 2010-08-12 NOTE — Discharge Summary (Signed)
NAME:  [name] [name], [name] [name]                 ACCOUNT NO.:  [id number]   MEDICAL RECORD NO.:  [id number]          PATIENT TYPE:  INP   LOCATION:  3738                         FACILITY:  [facility]   PHYSICIAN:  Kela Millin, M.D.DATE OF BIRTH:  1970/05/30   DATE OF ADMISSION:  08/13/2005  DATE OF DISCHARGE:  08/18/2005                                 DISCHARGE SUMMARY   DISCHARGE DIAGNOSES:  1.  Syncope, recurrent, unclear etiology, extensive workup negative.  2.  Hypertension.  3.  Hypertriglyceridemia.  4.  History of headaches.  5.  History of attention deficit hyperactivity disorder.  6.  Hypokalemia, resolved.   PROCEDURES AND STUDIES:  1.  MRI of the brain:  No acute ischemia.  2.  EEG:  No seizure activity.  3.  Carotid Doppler ultrasound:  No evidence of significant ICA stenosis.  4.  2-D echo:  Overall left ventricular systolic function at lower limits of      normal with ejection fraction 50-55%.  No left ventricular regional wall      motion abnormalities.  5.  Abdominal ultrasound:  Fatty liver.  6.  Tilt table:  Negative.  7.  Stress Cardiolite:  No inducible or reversible ischemia with exercise.      Normal wall motion and ejection fraction 56%.   CONSULTATIONS:  1.  Cardiology, [facility].  2.  Neurology, [facility]. Thad Ranger, M.D.   HISTORY:  The patient is a 40 year old white male with past medical history  significant for recurrent syncope, hypertension, history of low  testosterone, kidney cysts, hypercholesterolemia and hypertension, who  presented following a syncopal episode.  The patient reported that in the  past 1-2 years he has had many syncopal episodes.  He did not remember too  much about the episodes themselves but stated that he was found unconscious  many times by his brother or by his wife.  His brother was present at the  time he was seen in the ER and stated that he had found the patient  unconscious after passing out about 4 times.  He stated that he did  not  notice any jerking of his body at the time, no tongue biting and no  incontinence.  The patient reported on the day he presented that he had been  driving when he started feeling dizzy.  He called his wife and did not  remember exactly what happened after that.  When he woke up, he found  himself on the way to the ER with his wife.  They reported that the patient  had blacked out while he was talking on the phone and it was unclear for how  long that he was unconscious.  The patient denied chest pain, shortness of  breath, and no shaking of his body, no tongue biting, no urinary or bowel  incontinence.  The patient was seen at [facility], where he was advised  to go to [facility] for further evaluation.   PHYSICAL EXAMINATION:  His physical exam upon admission as per Michelene Gardener, MD, revealed a temperature of 97.4 with a  blood pressure of 133/91,  pulse of 84, respiratory rate of 25.  The pertinent findings on exam were  that the patient was alert and oriented, appeared anxious about his current  medical condition.  His abdomen was soft, nondistended.  There was mild  tenderness, diffuse, but reported to be chronic.  The rest of the physical  exam was reported to be within normal limits.   LABORATORY DATA:  A urine toxicology was negative.  Urinalysis was negative  for infection.  His blood glucose was 130, BUN 11, creatinine 1.1, calcium  9.7, sodium of 143, potassium 3.5, chloride 110, bicarb of 27.  White cell  count of 6.3, hemoglobin 15.5, hematocrit 39.5, his platelet count 246.  EKG  showed sinus rhythm at 90 beats per minute, no evidence of acute ischemia  noted.   HOSPITAL COURSE:  Problem 1.  RECURRENT SYNCOPE:  Upon admission the patient had serial  cardiac enzymes done, and those were negative for MI.  A 2-D echo was  ordered and the results are as stated above.  The patient also had thyroid  function tests and TSH of 0.731, a T5 of 8.0.  A serum cortisol level  was  also done, and it was within normal limits at 7.1.  Cardiology was consulted  and Dr. Armanda Magic initially saw the patient.  A stress Cardiolite was  ordered and the results are as stated above.  A tilt table test was also  done and it was negative.  Dr. Amil Amen saw the patient and stated that he  would consider an implantable loop recorder later upon follow-up with the  patient in the clinic.  The patient did not have any syncopal episodes  throughout this hospital stay.  The patient also had an MRI done, which was  negative for infarct, and carotid Dopplers did not show any significant ICA  stenosis.  Neurology was consulted and an EEG was done, and it was negative  for seizure activity.  Dr. Thad Ranger recommended that the patient be started  on Topamax for his headaches, and this was done.  The patient remained  hemodynamically stable, without any syncopal episodes throughout this  hospital stay.  He is to follow up with Dr. Tiburcio Pea and also with the  cardiologist, Dr. Amil Amen, upon discharge.   Problem 2.  HISTORY OF HEADACHES:  As discussed above, the patient was  started on Topamax upon discharge as per neurology recommendations.   Problem 3.  HYPERTENSION:  The patient was to continue his outpatient  antihypertensives upon discharge.   Problem 4  HYPERLIPIDEMIA:  The patient was maintained on Tricor during his  hospital stay.   DISCHARGE MEDICATIONS:  1.  Patient to continue preadmission medications.  2.  Topamax 25 mg p.o. q.h.s. and as directed.   FOLLOW-UP CARE:  1.  Holley Bouche, M.D., in one week.  2.  Francisca December, M.D., cardiologist, patient to call for appointment.   DISCHARGE CONDITION:  Stable.      Kela Millin, M.D.  Electronically Signed     ACV/MEDQ  D:  09/21/2005  T:  09/21/2005  Job:  [id number]   cc:   Holley Bouche, M.D.  Fax: [phone]   Francisca December, M.D.  Fax: [phone]

## 2010-08-12 NOTE — Discharge Summary (Signed)
NAME:  [name] [name], [name] [name]                 ACCOUNT NO.:  [id number]   MEDICAL RECORD NO.:  [id number]          PATIENT TYPE:  INP   LOCATION:  3025                         FACILITY:  [facility]   PHYSICIAN:  Bevelyn Buckles. Champey, M.D.DATE OF BIRTH:  1970/12/01   DATE OF ADMISSION:  12/11/2005  DATE OF DISCHARGE:  12/14/2005                                 DISCHARGE SUMMARY   REASON FOR ADMISSION:  Evaluation of spells by video AG monitoring.   PRIMARY DIAGNOSIS:  Non epileptic events.   SECONDARY DIAGNOSIS:  1. High blood pressure.  2. High cholesterol.   HOSPITAL COURSE:  [name] is a 40 year old Caucasian male who was  admitted for evaluation of atypical spells.  He was monitored with video AG  monitoring for 3 days.  During his 3 days of video AG monitoring, patient  had 4-5 push button events which were his typical events.  Please see full  dictated video AG monitoring report for details.  Patient's events consisted  of no definitive epileptiform activity.  There was a tremendous amount of  EMG movement artifact.  The background of the tracing did not change during  these events.  Patient had otherwise uneventful hospital stay.  While he was  in his normal state of health, he complained of some slight headaches which  were treated with Vicodin p.r.n.   DISCHARGE MEDICATIONS:  Include Celexa, Zocor, Tri Cor, Norvasc, Lotensin,  methylphenidate, AndroGel cream, Topamax and Vicodin p.r.n.   FOLLOWUP:  Patient is to followup with psychiatry, which was set up as an  outpatient, for non epileptic events and also his primary care physician for  his non epileptic events and further medical care.  Patient was instructed  not to drive secondary to his unresponsive episodes.  He should be at least  3-6 months without a spell prior to resuming driving.  Patient was  discharged in stable condition without any further compromise.      Bevelyn Buckles. Nash Shearer, M.D.  Electronically Signed     DRC/MEDQ  D:  12/14/2005  T:  12/14/2005  Job:  [id number]   cc:   Sarita Haver, MD

## 2010-08-12 NOTE — H&P (Signed)
NAME:  [place], [place]                 ACCOUNT NO.:  [id number]   MEDICAL RECORD NO.:  [id number]          PATIENT TYPE:  INP   LOCATION:  3025                         FACILITY:  [facility]   PHYSICIAN:  Bevelyn Buckles. Champey, M.D.DATE OF BIRTH:  1971/03/12   DATE OF ADMISSION:  12/11/2005  DATE OF DISCHARGE:                                HISTORY & PHYSICAL   REASON FOR ADMISSION:  Evaluation for spells, for video and EEG monitoring.   HISTORY OF PRESENT ILLNESS:  [name] [name] is a 40 year old Caucasian male who  has been having episodes of passing out with loss of consciousness.  The  patient has had extensive outpatient work-up including MRI, MRAs and EEGs  all of which have been unremarkable.  The patient was placed on Topamax for  his headaches without any change in frequency of events.  Frequency usually  consists of starting off with bilateral head pain, feelings of  lightheadedness, faint feeling and seeing flashing lights and sparks for  about 10 to 15 seconds prior to patient loosing consciousness.  Occasionally, he will not lose consciousness and just have the above  symptoms for 10 to 15 seconds.  Occasionally at times when he does pass out,  his body becomes very rigid and the patient has trembling and clenches his  fists.  He does not have any incontinence or eyes rolling.  The patient did  state he has bitten his tongue once.  Afterwards the patient is fairly  confused and disoriented.  The patient has his episodes anywhere from 0 per  week to three to four times per week.  After the episode along with being  confused and disoriented, the patient occasionally feels numbness in his  fingers.  The patient denies any other symptoms such as focal weakness,  numbness, vision changes, speech changes, swallowing problems, chewing  problems or true vertigo.   PAST MEDICAL HISTORY:  Positive for high blood pressure on cholesterol.   CURRENT MEDICATIONS:  Lotrel, Tri-Chlor, Zocor, Celexa,  Topamax, Ritalin,  AndroGel and Vicodin p.r.n.   ALLERGIES:  NO KNOWN DRUG ALLERGIES.   FAMILY HISTORY:  Positive for high blood pressure, high cholesterol,  migraine headaches, cancers.   SOCIAL HISTORY:  The patient currently lives with his family, denies any  tobacco, alcohol or drug use.   REVIEW OF SYSTEMS:  Positive for fainting spells, difficulty sleeping,  decreased energy, fatigue, headaches, confusion.  Review of systems negative  as per history of present illness in greater than 7 other organ systems.   PHYSICAL EXAMINATION:  VITAL SIGNS:  Stable.  Patient is afebrile.  GENERAL:  This is a 40 year old Caucasian male in no acute distress,  comfortable, cooperative throughout the entire examination.  HEENT:  Normocephalic and atraumatic.  Extraocular movements are intact.  Pupils equal, round and reactive to light.  NECK:  Supple, no carotid bruits.  HEART:  Regular, lungs are clear.  ABDOMEN:  Soft and nontender.  EXTREMITIES:  No edema, good pulses.  NEUROLOGIC:  Patient is awake, alert, and oriented.  Language is fluent,  memory is within normal limits.  Cranial  nerves 2 through 12 are grossly  intact.  Motor examination shows 5/5 strength and normal tone in all four  extremities.  No drift is noted.  Sensory examination is within normal  limits to light touch.  Reflexes are 1 to 2+ and symmetric.  Cerebellar  function is within normal limits finger-to-nose.  Gait is unremarkable.   IMPRESSION:  [name] [name] is a 40 year old Caucasian male who is admitted for  evaluation of spells to evaluate for seizures versus other etiology for his  e events.  His workups in the past with MRIs and EEG have been essentially  unremarkable.  We will admit the patient and place him on video EEG  monitoring to try and capture these events.  We will continue his home  medications for now.  We will follow the patient while he is in the  hospital.      Bevelyn Buckles. Nash Shearer, M.D.   Electronically Signed     DRC/MEDQ  D:  12/11/2005  T:  12/11/2005  Job:  [id number]

## 2010-08-12 NOTE — Procedures (Signed)
EEG NUMBER:  [id number].   HISTORY:  This is a 40 year old with syncope and headaches, who is  having an EEG done to evaluate for seizures.   PROCEDURE:  This is a routine EEG.   TECHNICAL DESCRIPTION:  Throughout this routine EEG there is a posterior-  dominant rhythm of 10 Hz activity at 10-15 microvolts.  The background  activity is symmetric, mostly comprised of alpha-range activity at 10-20  microvolts.  With photic stimulation there is a mild symmetric photic  driving response noted.  Hyperventilation does not produce any  significant abnormalities.  The patient does become drowsy, however does  not enter stage II sleep throughout this record.  There is no evidence  of electrographic seizures or interictal discharge activity.   IMPRESSION:  This routine EEG is within normal limits in the awake  state.      Bevelyn Buckles. Nash Shearer, M.D.  Electronically Signed     ZOX:WRUE  D:  06/20/2006 11:08:56  T:  06/20/2006 11:35:27  Job #:  [id number]

## 2010-12-23 LAB — CBC
HCT: 48.6
Hemoglobin: 16.9
MCHC: 34.8
MCV: 86.6
Platelets: 235
RBC: 5.61
RDW: 11.8
WBC: 10.4

## 2010-12-23 LAB — DIFFERENTIAL
Basophils Absolute: 0
Basophils Relative: 0
Eosinophils Absolute: 0.1
Eosinophils Relative: 1
Lymphocytes Relative: 23
Lymphs Abs: 2.4
Monocytes Absolute: 0.7
Monocytes Relative: 7
Neutro Abs: 7.2
Neutrophils Relative %: 69

## 2010-12-23 LAB — POCT TOXICOLOGY PANEL
Benzodiazepines: POSITIVE
Opiates: POSITIVE

## 2010-12-23 LAB — COMPREHENSIVE METABOLIC PANEL WITH GFR
ALT: 34
Albumin: 4.4
Calcium: 9.1
Glucose, Bld: 109 — ABNORMAL HIGH
Potassium: 4
Sodium: 139
Total Protein: 7.2

## 2010-12-23 LAB — COMPREHENSIVE METABOLIC PANEL
AST: 23
Alkaline Phosphatase: 63
BUN: 9
CO2: 29
Chloride: 103
Creatinine, Ser: 1
GFR calc Af Amer: >60
GFR calc non Af Amer: >60
Total Bilirubin: 1

## 2010-12-23 LAB — ETHANOL: Alcohol, Ethyl (B): <5

## 2011-01-05 LAB — I-STAT 8, (EC8 V) (CONVERTED LAB)
BUN: 14
Bicarbonate: 27.9 — ABNORMAL HIGH
Chloride: 104
Glucose, Bld: 101 — ABNORMAL HIGH
HCT: 43
Hemoglobin: 14.6
Operator id: 294501
Potassium: 4.3
Sodium: 137

## 2011-01-05 LAB — DRUGS OF ABUSE SCREEN W/O ALC, ROUTINE URINE
Benzodiazepines.: POSITIVE — AB
Cocaine Metabolites: NEGATIVE
Methadone: NEGATIVE
Opiate Screen, Urine: NEGATIVE
Phencyclidine (PCP): NEGATIVE
Propoxyphene: NEGATIVE

## 2011-01-05 LAB — URINALYSIS, ROUTINE W REFLEX MICROSCOPIC
Glucose, UA: NEGATIVE
Hgb urine dipstick: NEGATIVE
Ketones, ur: NEGATIVE
Protein, ur: NEGATIVE
Urobilinogen, UA: 0.2

## 2011-01-05 LAB — BENZODIAZEPINE, QUANTITATIVE, URINE
Alprazolam (GC/LC/MS), ur confirm: 420 ng/mL
Flurazepam GC/MS Conf: NEGATIVE
Nordiazepam GC/MS Conf: NEGATIVE

## 2011-01-05 LAB — RAPID URINE DRUG SCREEN, HOSP PERFORMED
Amphetamines: NOT DETECTED
Benzodiazepines: POSITIVE — AB
Opiates: POSITIVE — AB
Tetrahydrocannabinol: NOT DETECTED

## 2011-01-05 LAB — ETHANOL: Alcohol, Ethyl (B): <5

## 2012-04-12 DIAGNOSIS — R519 Headache, unspecified: Secondary | ICD-10-CM | POA: Insufficient documentation

## 2014-07-15 DIAGNOSIS — R0683 Snoring: Secondary | ICD-10-CM | POA: Insufficient documentation

## 2014-10-02 DIAGNOSIS — F325 Major depressive disorder, single episode, in full remission: Secondary | ICD-10-CM | POA: Insufficient documentation

## 2014-10-02 DIAGNOSIS — F902 Attention-deficit hyperactivity disorder, combined type: Secondary | ICD-10-CM | POA: Insufficient documentation

## 2015-04-01 DIAGNOSIS — G932 Benign intracranial hypertension: Secondary | ICD-10-CM | POA: Insufficient documentation

## 2015-04-01 DIAGNOSIS — M961 Postlaminectomy syndrome, not elsewhere classified: Secondary | ICD-10-CM | POA: Insufficient documentation

## 2017-01-03 DIAGNOSIS — Z0289 Encounter for other administrative examinations: Secondary | ICD-10-CM | POA: Insufficient documentation

## 2018-02-13 ENCOUNTER — Encounter: Payer: Self-pay | Admitting: Internal Medicine

## 2018-02-27 ENCOUNTER — Other Ambulatory Visit: Payer: Self-pay | Admitting: Surgery

## 2018-02-27 DIAGNOSIS — E079 Disorder of thyroid, unspecified: Secondary | ICD-10-CM

## 2018-03-06 ENCOUNTER — Ambulatory Visit
Admission: RE | Admit: 2018-03-06 | Discharge: 2018-03-06 | Disposition: A | Discharge status: Home / Self Care | Payer: Medicare Other | Source: Ambulatory Visit | Attending: Surgery | Admitting: Surgery

## 2018-03-06 DIAGNOSIS — E079 Disorder of thyroid, unspecified: Secondary | ICD-10-CM

## 2018-06-05 ENCOUNTER — Ambulatory Visit (INDEPENDENT_AMBULATORY_CARE_PROVIDER_SITE_OTHER): Payer: Medicare Other | Admitting: Internal Medicine

## 2018-06-05 ENCOUNTER — Other Ambulatory Visit: Payer: Self-pay

## 2018-06-05 ENCOUNTER — Encounter: Payer: Self-pay | Admitting: Internal Medicine

## 2018-06-05 VITALS — BP 150/80 | HR 102 | Ht 66.0 in | Wt 214.0 lb

## 2018-06-05 DIAGNOSIS — E039 Hypothyroidism, unspecified: Secondary | ICD-10-CM

## 2018-06-05 DIAGNOSIS — E042 Nontoxic multinodular goiter: Secondary | ICD-10-CM | POA: Diagnosis not present

## 2018-06-05 LAB — T3, FREE: T3 FREE: 3.5 pg/mL (ref 2.3–4.2)

## 2018-06-05 LAB — TSH: TSH: 3.66 u[IU]/mL (ref 0.35–4.50)

## 2018-06-05 LAB — T4, FREE: Free T4: 0.73 ng/dL (ref 0.60–1.60)

## 2018-06-05 NOTE — Progress Notes (Signed)
Patient ID: [name] [name], male   DOB: June 08, 1970, 48 y.o.   MRN: [id number]    HPI  [name] KIMBAL BADDER is a 48 y.o.-year-old male, referred by Dr. Gerrit Friends, for management of hypothyroidism and thyroid nodules.  Patient remembered that he had a prolonged URI (over 2 months, 2x ABx courses) in fall 2019.  Right after this, he started to lose weight unintentionally (lost approximately 28 pounds in 1.5 months), to feel tremulous, to have palpitations.  He presented to see his PCP.  He does not remember details of the investigation but he remembers that the thyroid tests were abnormal.  A thyroid ultrasound showed small thyroid nodules and an atrophic, heterogeneous, thyroid.  He was referred to both endocrinology and surgery (Dr. Gerrit Friends).  Dr. Gerrit Friends saw the patient and did not think that he needed thyroid surgery.  Repeat TFTs were actually in the hypothyroid range.  He re-ordered referral to endocrinology.  I reviewed pt's thyroid tests: 02/20/2018: TSH 5.25 (0.4-4.5), total T4 4.3 (4.9-10.5) -per Dr. Gerrit Friends Unfortunately, I do not have the results from Dr. Concepcion Elk No results found for: TSH, FREET4, T3FREE  Thyroid ultrasound (03/06/2018):  Parenchymal Echotexture: Moderately heterogenous Isthmus: Normal in size measures 0.4 cm in diameter Right lobe: Slightly atrophic in size measuring 4.0 x 1.4 x 1.6 cm Left lobe: Slightly atrophic in size measuring 4.1 x 1.1 x 1.2 cm _________________________________________________________  There is a 0.8 cm hyperechoic nodule within the anterior mid aspect the right lobe of the thyroid which does not meet imaging criteria to recommend percutaneous sampling or continued dedicated follow-up.  Questioned approximately 1.2 x 0.8 x 0.7 cm isoechoic nodule about the mid, posterior aspect the right lobe of the thyroid is favored to represent lobular extension of thyroid parenchyma as opposed to a discrete nodule as it lacks definitive borders on both  the longitudinal and transverse images.  Questioned approximately 1.3 x 1.0 x 0.7 cm isoechoic nodule at the inferior aspect the right lobe of the thyroid is favored to represent a pseudonodule as it lacks defined borders on both the provided sagittal and transverse images.  Questioned approximately 0.9 x 0.8 x 0.6 cm nodule involving the inferior pole of the left lobe of the thyroid is favored to represent a pseudonodule as it lacks defined borders on both provided sagittal and transverse images.  IMPRESSION: Slightly atrophic and moderately heterogeneous appearing thyroid gland without discrete worrisome nodule or mass. Findings are nonspecific though could be seen in the setting of a chronic thyroiditis. Clinical correlation is advised.  Pt describes: - no energy, + fatigue - + HAs - increased recently (he has these chronically - pseudotumor cerebri - on Methadone - [facility] pain [facility]) - + weight gain now: 23 lbs, but initially lost 28 lbs in 1.5 months - no cold but has heat intolerance - + constipation - not new - takes Miralax - + dry skin (forehead) - + hair loss - no anxiety or depression, feels "unmotivated"  Pt denies feeling nodules in neck, hoarseness, odynophagia, SOB with lying down.  However, he does have intermittent dysphagia with food and meds.  She has + FH of thyroid disorders in: mother - thyroid nodules. + FH of thyroid cancer in maternal aunt, 1st cousin.  No h/o radiation tx to head or neck. No recent use of iodine supplements.  Pt. also has a history of diabetes, hypogonadism, methadone use.  He also has a history of pseudotumor cerebri, with severe headaches before diagnosis to the point  of passing out.  Currently on methadone for this. 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 Marital status: Married    Spouse name: Not on file  . Number of children: 2  . Years of education: Not on file  . Highest education level: Not on file  Occupational History  .  On disability  Social Needs  . Financial resource strain: Not on file  . Food insecurity:    Worry: Not on file    Inability: Not on file  . Transportation needs:    Medical: Not on file    Non-medical: Not on file  Tobacco Use  . Smoking status: Never Smoker  . Smokeless tobacco: Never Used  Substance and Sexual Activity  . Alcohol use: No  . Drug use: No   Current Outpatient Medications on File Prior to Visit  Medication Sig Dispense Refill  . amLODipine (NORVASC) 10 MG tablet Take 10 mg by mouth.    . metFORMIN (GLUCOPHAGE) 500 MG tablet TK 1 T PO  BID    . methadone (DOLOPHINE) 10 MG tablet TK 1 T PO QID AND 1 AND 1/2 T HS    . olmesartan-hydrochlorothiazide (BENICAR HCT) 40-25 MG tablet Take 1 tablet by mouth daily.    . ONGLYZA 5 MG TABS tablet TK 1 T PO QD    . Testosterone Cypionate 200 MG/ML SOLN Inject into the muscle.    . Vitamin D, Ergocalciferol, (DRISDOL) 1.25 MG (50000 UT) CAPS capsule TK ONE C PO Q WEEK     No current facility-administered medications on file prior to visit.    Allergies  Allergen Reactions  . Amoxicillin Hives  . Benzodiazepines  Other (See Comments)    Other reaction(s): Other (See Comments) Other Reaction: makes him feel/act intoxicated Other Reaction: makes him feel/act intoxicated   . Acetazolamide Other (See Comments), Rash and Swelling    Other Reaction: redness   Family history: -Mother with DM, HTN, thyroid nodules, breast cancer -Brother with HTN -Grandfather with heart disease -Aunt with thyroid cancer  PE: BP (!) 150/80   Pulse (!) 102   Ht  (1.676 m)   Wt 214 lb (97.1 kg)   SpO2 97%   BMI 34.54 kg/m  Wt Readings from Last 3 Encounters:  06/05/18 214 lb (97.1 kg)  04/13/06 216 lb (98 kg)   Constitutional: overweight, in NAD Eyes: PERRLA, EOMI, no exophthalmos ENT: moist mucous membranes, no thyromegaly, no cervical lymphadenopathy Cardiovascular: Tachycardia, RR, No MRG Respiratory: CTA B Gastrointestinal: abdomen soft, NT, ND, BS+ Musculoskeletal: no deformities, strength intact in all 4 Skin: moist, flushed, warm, no rashes Neurological: + Very fine right hand tremor with outstretched hands, DTR normal in all 4  ASSESSMENT: 1. Hypothyroidism  2. Thyroid nodules  PLAN:  1. Patient with dramatic presentation of what appears to be thyrotoxicosis last  fall.  Upon questioning, he had a prolonged URI with significant fever for approximately 2 months prior to the episode.  He then started to feel tremulous, having palpitations, losing a significant amount of weight in a very short time, fatigued, for which he saw PCP.  I do not have the results of this investigation, but I suspect that he was thyrotoxic at that time.  He was sent to endocrinology and surgery and was able to see Dr. Gerrit Friends first.  At that time, his TFTs were checked again and they were hypothyroid he was referred again to endocrinology at that time.   - At this visit, patient does not complain of thyrotoxic symptoms except maybe for heat intolerance, which he had for his entire life.  He, however, has a rapid heart rate  and his skin is flushed and moist.  He has very faint tremor in the right hand, which is chronic.  He does mention that this exacerbated at the time when he saw the PCP last year, and now it is much better -We discussed about possible etiologies of thyrotoxicosis to include Graves' disease, toxic nodules, and, most likely in his case, an episode of thyroiditis.  We discussed at length about the course of thyroiditis with an initial eflux of thyroid hormones from the inflamed thyroid followed by hypothyroidism after the thyroid hormone supply is exhausted, usually followed by normalization of TFTs, but sometimes persistent hypothyroidism. - We discussed about correct intake of levothyroxine in case we need to start, fasting, with water, separated by at least 30 minutes from breakfast, and separated by more than 4 hours from calcium, iron, multivitamins, acid reflux medications (PPIs). - will check thyroid tests today: TSH, free T3, free T4, and also ATA and TPO antibodies to screen for Hashimoto's thyroiditis - If labs today are abnormal, he will need to return in ~6 weeks for repeat labs - Otherwise, I will see him back in 3-4 months  2. Thyroid nodules - We reviewed together the report and the images of his thyroid ultrasound from 03/06/2018: The thyroid nodules appear small, and not worrisome.  The thyroid appears heterogeneous, most likely indicating chronic thyroiditis, most likely the cause for his abnormal TFTs. - We discussed the thyroid nodules appearing in the context of chronic thyroiditis and these may be just sites of inflammation (pseudo-nodules) - Patient denies neck compression symptoms other than chronic dysphagia.  This could have been due to the original inflammatory process, rather than the nodules themselves, since they are so small - No intervention is needed for now, but I would like to repeat a thyroid ultrasound in a year from the previous to follow the nodules  Component      Latest Ref Rng & Units 06/05/2018  TSH     0.35 - 4.50 uIU/mL 3.66  T4,Free(Direct)     0.60 - 1.60 ng/dL 0.48  Triiodothyronine,Free,Serum     2.3 - 4.2 pg/mL 3.5  Thyroperoxidase Ab SerPl-aCnc     <9 IU/mL <1  Thyroglobulin Ab     < or = 1 IU/mL <1  All tests are normal, confirming resolved subacute thyroiditis.  I will have him come back in 1.5 months for repeat TFTs.  Carlus Pavlov, MD PhD [facility] Endocrinology

## 2018-06-05 NOTE — Patient Instructions (Signed)
Please stop at the lab.   Please come back for a follow-up appointment in 3-4 months.   

## 2018-06-06 ENCOUNTER — Encounter: Payer: Self-pay | Admitting: Internal Medicine

## 2018-06-06 LAB — THYROGLOBULIN ANTIBODY: Thyroglobulin Ab: <1 IU/mL (ref <=1)

## 2018-06-06 LAB — THYROID PEROXIDASE ANTIBODY: Thyroperoxidase Ab SerPl-aCnc: <1 IU/mL (ref <9)

## 2018-10-01 ENCOUNTER — Ambulatory Visit: Payer: Medicare Other | Admitting: Internal Medicine

## 2018-10-01 DIAGNOSIS — Z0289 Encounter for other administrative examinations: Secondary | ICD-10-CM

## 2019-08-28 ENCOUNTER — Other Ambulatory Visit: Payer: Self-pay

## 2019-08-28 ENCOUNTER — Ambulatory Visit (INDEPENDENT_AMBULATORY_CARE_PROVIDER_SITE_OTHER): Payer: Medicare Other | Admitting: Sports Medicine

## 2019-08-28 ENCOUNTER — Encounter: Payer: Self-pay | Admitting: Sports Medicine

## 2019-08-28 ENCOUNTER — Ambulatory Visit (INDEPENDENT_AMBULATORY_CARE_PROVIDER_SITE_OTHER): Payer: Medicare Other

## 2019-08-28 DIAGNOSIS — M722 Plantar fascial fibromatosis: Secondary | ICD-10-CM | POA: Diagnosis not present

## 2019-08-28 DIAGNOSIS — M79672 Pain in left foot: Secondary | ICD-10-CM | POA: Diagnosis not present

## 2019-08-28 DIAGNOSIS — Z87898 Personal history of other specified conditions: Secondary | ICD-10-CM | POA: Insufficient documentation

## 2019-08-28 MED ORDER — DICLOFENAC EPOLAMINE 1.3 % EX PTCH: 1.0000 | MEDICATED_PATCH | Freq: Every day | CUTANEOUS | 0 refills | Status: DC

## 2019-08-28 NOTE — Progress Notes (Signed)
Subjective: [name] [name] is a 49 y.o. male patient presents to office with complaint of mild arch pain on the left>right. Patient admits to pain in arch for the last month, 2/10 irriating in nature with KNOT getting bigger, can feel it with walking reports that he has tried changing shoes and has noticed that it is worse when he has on his Sunday shoes.  Patient has treated this problem with chronic pain medication which he takes for his disability with no relief. Denies any other pedal complaints.  Denies any trauma or injury.  ROS   Patient Active Problem List   Diagnosis Date Noted   History of pseudoseizure 08/28/2019   Multiple thyroid nodules 06/05/2018   Acquired hypothyroidism 06/05/2018   Pain medication agreement signed 01/03/2017   Post laminectomy syndrome 04/01/2015   Pseudotumor cerebri 04/01/2015   ADHD (attention deficit hyperactivity disorder), combined type 10/02/2014   Depression, major, in remission ([place]) 10/02/2014   Snoring 07/15/2014   Long term current use of opiate analgesic 01/16/2013   Hyperlipidemia 08/31/2012   Type II diabetes mellitus ([place]) 08/31/2012   Headache 04/12/2012   Chronic pain 08/24/2011   DEPRESSION 11/03/2006   HYPERTENSION 11/03/2006    Current Outpatient Medications on File Prior to Visit  Medication Sig Dispense Refill   amLODipine (NORVASC) 10 MG tablet Take 10 mg by mouth.     metFORMIN (GLUCOPHAGE) 500 MG tablet TK 1 T PO  BID     methadone (DOLOPHINE) 10 MG tablet TK 1 T PO QID AND 1 AND 1/2 T HS     olmesartan-hydrochlorothiazide (BENICAR HCT) 40-25 MG tablet Take 1 tablet by mouth daily.     ONGLYZA 5 MG TABS tablet TK 1 T PO QD     Testosterone Cypionate 200 MG/ML SOLN Inject into the muscle.     Vitamin D, Ergocalciferol, (DRISDOL) 1.25 MG (50000 UT) CAPS capsule TK ONE C PO Q WEEK     No current facility-administered medications on file prior to visit.    Allergies  Allergen Reactions    Amoxicillin Hives   Benzodiazepines Other (See Comments)    Other reaction(s): Other (See Comments) Other Reaction: makes him feel/act intoxicated Other Reaction: makes him feel/act intoxicated    Acetazolamide Other (See Comments), Rash and Swelling    Other Reaction: redness    Objective: Physical Exam General: The patient is alert and oriented x3 in no acute distress.  Dermatology: Skin is warm, dry and supple bilateral lower extremities. Nails 1-10 are normal. There is no erythema, edema, no eccymosis, no open lesions present.  Raised soft tissue mass nonfluctuant nonpulsatile noted at the left arch that measures 1.5x 1.5 cm.  There is also a mass same nature in the right arch that measures 0.5 x 0.5 cm, integument is otherwise unremarkable.   Vascular: Dorsalis Pedis pulse and Posterior Tibial pulse are 2/4 bilateral. Capillary fill time is immediate to all digits.  Neurological: Grossly intact to light touch.   Musculoskeletal: Tenderness to palpation at the medial arch at raised soft tissue mass left greater than right.  Limited ankle joint range of motion bilateral.  Gait: Unassisted  X-ray left foot: Normal osseous mineralization. Joint spaces preserved. No fracture/dislocation/boney destruction. Calcaneal spur present with mild thickening of plantar fascia. No other soft tissue abnormalities or radiopaque foreign bodies.   Assessment and Plan: Problem List Items Addressed This Visit    None    Visit Diagnoses    Left foot pain    -  Primary   Relevant Orders   DG Foot Complete Left   Plantar fascial fibromatosis       Arch pain of left foot          -Complete examination performed.  -Xrays reviewed -Discussed treatment options for plantar fibroma left greater than right arch -Due to mild symptoms prescribed Flector patch for patient to use daily to the area advised patient if fails to improve may try topical compound versus physical therapy to help with  fibroma -Recommended good supportive shoes  -Explained and dispensed to patient daily stretching exercises. -Recommend patient to ice affected area 1-2x daily. -Patient to return to office in 4 to 6 weeks for follow up or sooner if problems or questions arise.  Asencion Islam, DPM

## 2019-09-01 ENCOUNTER — Other Ambulatory Visit: Payer: Self-pay | Admitting: Sports Medicine

## 2019-09-01 DIAGNOSIS — M722 Plantar fascial fibromatosis: Secondary | ICD-10-CM

## 2019-10-08 ENCOUNTER — Ambulatory Visit: Payer: Medicare Other | Admitting: Sports Medicine

## 2020-02-25 DIAGNOSIS — J189 Pneumonia, unspecified organism: Secondary | ICD-10-CM

## 2020-02-25 HISTORY — DX: Pneumonia, unspecified organism: J18.9

## 2020-03-30 ENCOUNTER — Other Ambulatory Visit: Payer: Self-pay

## 2020-03-30 ENCOUNTER — Encounter (HOSPITAL_BASED_OUTPATIENT_CLINIC_OR_DEPARTMENT_OTHER): Payer: Self-pay

## 2020-03-30 DIAGNOSIS — N200 Calculus of kidney: Secondary | ICD-10-CM | POA: Insufficient documentation

## 2020-03-30 DIAGNOSIS — U071 COVID-19: Secondary | ICD-10-CM | POA: Insufficient documentation

## 2020-03-30 DIAGNOSIS — E119 Type 2 diabetes mellitus without complications: Secondary | ICD-10-CM | POA: Insufficient documentation

## 2020-03-30 DIAGNOSIS — Z7984 Long term (current) use of oral hypoglycemic drugs: Secondary | ICD-10-CM | POA: Diagnosis not present

## 2020-03-30 DIAGNOSIS — I10 Essential (primary) hypertension: Secondary | ICD-10-CM | POA: Insufficient documentation

## 2020-03-30 DIAGNOSIS — Z888 Allergy status to other drugs, medicaments and biological substances status: Secondary | ICD-10-CM | POA: Diagnosis not present

## 2020-03-30 DIAGNOSIS — N201 Calculus of ureter: Secondary | ICD-10-CM | POA: Diagnosis present

## 2020-03-30 DIAGNOSIS — Z880 Allergy status to penicillin: Secondary | ICD-10-CM | POA: Diagnosis not present

## 2020-03-30 DIAGNOSIS — Z79899 Other long term (current) drug therapy: Secondary | ICD-10-CM | POA: Diagnosis not present

## 2020-03-30 LAB — CBC
HCT: 40.4 % (ref 39.0–52.0)
Hemoglobin: 14 g/dL (ref 13.0–17.0)
MCH: 29.6 pg (ref 26.0–34.0)
MCHC: 34.7 g/dL (ref 30.0–36.0)
MCV: 85.4 fL (ref 80.0–100.0)
Platelets: 230 10*3/uL (ref 150–400)
RBC: 4.73 MIL/uL (ref 4.22–5.81)
RDW: 11.9 % (ref 11.5–15.5)
WBC: 8.9 10*3/uL (ref 4.0–10.5)
nRBC: 0 % (ref 0.0–0.2)

## 2020-03-30 LAB — URINALYSIS, ROUTINE W REFLEX MICROSCOPIC
Bilirubin Urine: NEGATIVE
Glucose, UA: NEGATIVE mg/dL
Hgb urine dipstick: NEGATIVE
Ketones, ur: NEGATIVE mg/dL
Leukocytes,Ua: NEGATIVE
Nitrite: NEGATIVE
Protein, ur: NEGATIVE mg/dL
Specific Gravity, Urine: 1.015 (ref 1.005–1.030)
pH: 5 (ref 5.0–8.0)

## 2020-03-30 LAB — BASIC METABOLIC PANEL
Anion gap: 13 (ref 5–15)
BUN: 16 mg/dL (ref 6–20)
CO2: 28 mmol/L (ref 22–32)
Calcium: 9.2 mg/dL (ref 8.9–10.3)
Chloride: 93 mmol/L — ABNORMAL LOW (ref 98–111)
Creatinine, Ser: 1.27 mg/dL — ABNORMAL HIGH (ref 0.61–1.24)
GFR, Estimated: >60 mL/min (ref >60)
Glucose, Bld: 142 mg/dL — ABNORMAL HIGH (ref 70–99)
Potassium: 3.5 mmol/L (ref 3.5–5.1)
Sodium: 134 mmol/L — ABNORMAL LOW (ref 135–145)

## 2020-03-30 NOTE — ED Triage Notes (Addendum)
Pt states he was dx with a kidney stone at [facility] ED yesterday-pain to right flank pain and fever-NAD-steady gait-pt added he was also dx with PNA yesterday-no covid test

## 2020-03-31 ENCOUNTER — Emergency Department (HOSPITAL_COMMUNITY): Payer: Medicare Other | Admitting: Anesthesiology

## 2020-03-31 ENCOUNTER — Emergency Department (HOSPITAL_BASED_OUTPATIENT_CLINIC_OR_DEPARTMENT_OTHER)
Admission: EM | Admit: 2020-03-31 | Discharge: 2020-03-31 | Disposition: A | Discharge status: Home / Self Care | Payer: Medicare Other | Source: Home / Self Care | Attending: Emergency Medicine | Admitting: Emergency Medicine

## 2020-03-31 ENCOUNTER — Other Ambulatory Visit: Payer: Self-pay

## 2020-03-31 ENCOUNTER — Ambulatory Visit (HOSPITAL_COMMUNITY)
Admission: EM | Admit: 2020-03-31 | Discharge: 2020-03-31 | Disposition: A | Discharge status: Home / Self Care | Payer: Medicare Other | Attending: Emergency Medicine | Admitting: Emergency Medicine

## 2020-03-31 ENCOUNTER — Encounter (HOSPITAL_COMMUNITY): Payer: Self-pay

## 2020-03-31 ENCOUNTER — Emergency Department (HOSPITAL_COMMUNITY): Payer: Medicare Other

## 2020-03-31 ENCOUNTER — Encounter (HOSPITAL_COMMUNITY)
Admission: EM | Disposition: A | Discharge status: Home / Self Care | Payer: Self-pay | Source: Home / Self Care | Attending: Emergency Medicine

## 2020-03-31 DIAGNOSIS — Z880 Allergy status to penicillin: Secondary | ICD-10-CM | POA: Insufficient documentation

## 2020-03-31 DIAGNOSIS — N200 Calculus of kidney: Secondary | ICD-10-CM

## 2020-03-31 DIAGNOSIS — U071 COVID-19: Secondary | ICD-10-CM

## 2020-03-31 DIAGNOSIS — Z79899 Other long term (current) drug therapy: Secondary | ICD-10-CM | POA: Insufficient documentation

## 2020-03-31 DIAGNOSIS — N201 Calculus of ureter: Secondary | ICD-10-CM | POA: Diagnosis not present

## 2020-03-31 DIAGNOSIS — Z7984 Long term (current) use of oral hypoglycemic drugs: Secondary | ICD-10-CM | POA: Diagnosis not present

## 2020-03-31 DIAGNOSIS — Z888 Allergy status to other drugs, medicaments and biological substances status: Secondary | ICD-10-CM | POA: Insufficient documentation

## 2020-03-31 HISTORY — DX: Benign intracranial hypertension: G93.2

## 2020-03-31 HISTORY — PX: CYSTOSCOPY W/ URETERAL STENT PLACEMENT: SHX1429

## 2020-03-31 HISTORY — DX: Type 2 diabetes mellitus without complications: E11.9

## 2020-03-31 HISTORY — DX: Essential (primary) hypertension: I10

## 2020-03-31 LAB — RESP PANEL BY RT-PCR (FLU A&B, COVID) ARPGX2
Influenza A by PCR: NEGATIVE
Influenza B by PCR: NEGATIVE
SARS Coronavirus 2 by RT PCR: POSITIVE — AB

## 2020-03-31 LAB — CBG MONITORING, ED: Glucose-Capillary: 167 mg/dL — ABNORMAL HIGH (ref 70–99)

## 2020-03-31 SURGERY — CYSTOSCOPY, WITH RETROGRADE PYELOGRAM AND URETERAL STENT INSERTION
Anesthesia: General | Laterality: Right

## 2020-03-31 MED ORDER — HYDROMORPHONE HCL 1 MG/ML IJ SOLN
1.0000 mg | Freq: Once | INTRAMUSCULAR | Status: AC
Start: 2020-03-31 — End: 2020-03-31
  Administered 2020-03-31: 1 mg via INTRAVENOUS
  Filled 2020-03-31: qty 1

## 2020-03-31 MED ORDER — HYDROMORPHONE HCL 1 MG/ML IJ SOLN: 0.2500 mg | INTRAMUSCULAR | Status: DC | PRN

## 2020-03-31 MED ORDER — SODIUM CHLORIDE 0.9 % IR SOLN
Status: DC | PRN
  Administered 2020-03-31: 6000 mL

## 2020-03-31 MED ORDER — PROPOFOL 10 MG/ML IV BOLUS
INTRAVENOUS | Status: AC
  Filled 2020-03-31: qty 20

## 2020-03-31 MED ORDER — FENTANYL CITRATE (PF) 100 MCG/2ML IJ SOLN
INTRAMUSCULAR | Status: AC
  Filled 2020-03-31: qty 2

## 2020-03-31 MED ORDER — SODIUM CHLORIDE 0.9 % IV BOLUS
1000.0000 mL | Freq: Once | INTRAVENOUS | Status: AC
  Administered 2020-03-31: 1000 mL via INTRAVENOUS

## 2020-03-31 MED ORDER — LACTATED RINGERS IV SOLN: INTRAVENOUS | Status: DC | PRN

## 2020-03-31 MED ORDER — CIPROFLOXACIN IN D5W 400 MG/200ML IV SOLN: 400.0000 mg | Freq: Two times a day (BID) | INTRAVENOUS | Status: DC

## 2020-03-31 MED ORDER — CIPROFLOXACIN IN D5W 400 MG/200ML IV SOLN
400.0000 mg | Freq: Two times a day (BID) | INTRAVENOUS | Status: DC
  Administered 2020-03-31: 400 mg via INTRAVENOUS

## 2020-03-31 MED ORDER — MIDAZOLAM HCL 5 MG/5ML IJ SOLN
INTRAMUSCULAR | Status: DC | PRN
  Administered 2020-03-31: 2 mg via INTRAVENOUS

## 2020-03-31 MED ORDER — CIPROFLOXACIN HCL 500 MG PO TABS: 500.0000 mg | ORAL_TABLET | Freq: Two times a day (BID) | ORAL | 0 refills | Status: AC

## 2020-03-31 MED ORDER — MIDAZOLAM HCL 2 MG/2ML IJ SOLN
INTRAMUSCULAR | Status: AC
  Filled 2020-03-31: qty 2

## 2020-03-31 MED ORDER — FENTANYL CITRATE (PF) 250 MCG/5ML IJ SOLN
INTRAMUSCULAR | Status: DC | PRN
  Administered 2020-03-31: 100 ug via INTRAVENOUS

## 2020-03-31 MED ORDER — ONDANSETRON HCL 4 MG/2ML IJ SOLN
INTRAMUSCULAR | Status: DC | PRN
  Administered 2020-03-31: 4 mg via INTRAVENOUS

## 2020-03-31 MED ORDER — HYDROMORPHONE HCL 1 MG/ML IJ SOLN
1.0000 mg | Freq: Once | INTRAMUSCULAR | Status: AC
  Administered 2020-03-31: 1 mg via INTRAVENOUS
  Filled 2020-03-31: qty 1

## 2020-03-31 MED ORDER — LIDOCAINE 2% (20 MG/ML) 5 ML SYRINGE
INTRAMUSCULAR | Status: DC | PRN
  Administered 2020-03-31: 6 mg via INTRAVENOUS

## 2020-03-31 MED ORDER — SUCCINYLCHOLINE CHLORIDE 200 MG/10ML IV SOSY
PREFILLED_SYRINGE | INTRAVENOUS | Status: DC | PRN
  Administered 2020-03-31: 120 mg via INTRAVENOUS

## 2020-03-31 MED ORDER — CIPROFLOXACIN IN D5W 400 MG/200ML IV SOLN
INTRAVENOUS | Status: AC
  Filled 2020-03-31: qty 200

## 2020-03-31 MED ORDER — 0.9 % SODIUM CHLORIDE (POUR BTL) OPTIME
TOPICAL | Status: DC | PRN
  Administered 2020-03-31: 1000 mL

## 2020-03-31 MED ORDER — IOHEXOL 300 MG/ML  SOLN
INTRAMUSCULAR | Status: DC | PRN
  Administered 2020-03-31: 10 mL

## 2020-03-31 MED ORDER — PROPOFOL 10 MG/ML IV BOLUS
INTRAVENOUS | Status: DC | PRN
  Administered 2020-03-31: 150 mg via INTRAVENOUS

## 2020-03-31 SURGICAL SUPPLY — 15 items
BAG URO CATCHER STRL LF (MISCELLANEOUS) ×2 IMPLANT
CATH INTERMIT  6FR 70CM (CATHETERS) ×2 IMPLANT
CLOTH BEACON ORANGE TIMEOUT ST (SAFETY) ×2 IMPLANT
GLOVE SURG ENC TEXT LTX SZ7.5 (GLOVE) ×2 IMPLANT
GOWN STRL REUS W/TWL LRG LVL3 (GOWN DISPOSABLE) ×4 IMPLANT
GUIDEWIRE ANG ZIPWIRE 035X150 (WIRE) IMPLANT
GUIDEWIRE STR DUAL SENSOR (WIRE) ×2 IMPLANT
GUIDEWIRE ZIPWRE .038 STRAIGHT (WIRE) IMPLANT
KIT TURNOVER KIT A (KITS) IMPLANT
MANIFOLD NEPTUNE II (INSTRUMENTS) ×2 IMPLANT
PACK CYSTO (CUSTOM PROCEDURE TRAY) ×2 IMPLANT
STENT URET 6FRX24 CONTOUR (STENTS) ×2 IMPLANT
SYR 30ML LL (SYRINGE) ×2 IMPLANT
TUBING CONNECTING 10 (TUBING) ×2 IMPLANT
TUBING UROLOGY SET (TUBING) IMPLANT

## 2020-03-31 NOTE — Op Note (Signed)
Preoperative diagnosis:  1. Right ureteral stone, fever   Postoperative diagnosis:  1. Right ureteral stone, fever   Procedure:  1. Cystoscopy 2. Right ureteral stent placement (6 x 24 - no string)  3. Right retrograde pyelography with interpretation  Surgeon: Moody Bruins. M.D.  Anesthesia: General  Complications: None  Intraoperative findings: Right retrograde pyelography was performed with a 6 Fr ureteral catheter and omnipaque contrast.  This demonstrated a filling defect in the proximal right ureter consistent with the patients known stone.  No other abnormalities were noted.  EBL: Minimal  Specimens: None  Indication: [name] [name] is a 50 y.o. patient with a 7 mm right proximal ureteral stone with recent fever with COVID-19 diagnosis and uncontrolled pain. After reviewing the management options for treatment, he elected to proceed with the above surgical procedure(s). We have discussed the potential benefits and risks of the procedure, side effects of the proposed treatment, the likelihood of the patient achieving the goals of the procedure, and any potential problems that might occur during the procedure or recuperation. Informed consent has been obtained.  His pain was poorly controlled and although his fever was felt to most likely be related to his COVID diagnosis, he required stent placement regardless due to his pain symptoms.  Description of procedure:  The patient was taken to the operating room and general anesthesia was induced.  The patient was placed in the dorsal lithotomy position, prepped and draped in the usual sterile fashion, and preoperative antibiotics were administered. A preoperative time-out was performed.   Cystourethroscopy was performed.  The patient's urethra was examined and was normal. The bladder was then systematically examined in its entirety. There was no evidence for any bladder tumors, stones, or other mucosal pathology.    Attention  then turned to the right ureteral orifice and a ureteral catheter was used to intubate the ureteral orifice.  Omnipaque contrast was injected through the ureteral catheter and a retrograde pyelogram was performed with findings as dictated above.  A 0.38 sensor guidewire was then advanced up the right ureter into the renal pelvis under fluoroscopic guidance.  The wire was then backloaded through the cystoscope and a ureteral stent was advance over the wire using Seldinger technique.  The stent was positioned appropriately under fluoroscopic and cystoscopic guidance.  The wire was then removed with an adequate stent curl noted in the renal pelvis as well as in the bladder.  The bladder was then emptied and the procedure ended.  The patient appeared to tolerate the procedure well and without complications.  The patient was able to be awakened and transferred to the recovery unit in satisfactory condition.    Moody Bruins MD

## 2020-03-31 NOTE — Anesthesia Procedure Notes (Signed)
Procedure Name: Intubation Date/Time: 03/31/2020 6:47 PM Performed by: Elyn Peers, CRNA Pre-anesthesia Checklist: Patient identified, Emergency Drugs available, Suction available, Patient being monitored and Timeout performed Patient Re-evaluated:Patient Re-evaluated prior to induction Oxygen Delivery Method: Circle system utilized Preoxygenation: Pre-oxygenation with 100% oxygen Induction Type: IV induction, Rapid sequence and Cricoid Pressure applied Laryngoscope Size: Miller and 3 Grade View: Grade II Tube type: Oral Tube size: 7.5 mm Number of attempts: 1 Airway Equipment and Method: Stylet Placement Confirmation: ETT inserted through vocal cords under direct vision,  positive ETCO2 and breath sounds checked- equal and bilateral Tube secured with: Tape Dental Injury: Teeth and Oropharynx as per pre-operative assessment

## 2020-03-31 NOTE — ED Provider Notes (Signed)
[facility]-EMERGENCY DEPT [facility] Emergency Department Provider Note MRN:  [id number]  Arrival date & time: 03/31/20     Chief Complaint   Nephrolithiasis   History of Present Illness   [name] [name] is a 50 y.o. year-old male with a history of hypertension, diabetes presenting to the ED with chief complaint of nephrolithiasis.  Patient here with continued right flank pain for the past 2 weeks, getting worse, was diagnosed with a large kidney stone few days ago.  He is also endorsing intermittent fevers for the past 1 to 2 weeks.  Mild sore throat but no other infectious symptoms.  No blood in the urine, no burning with urination, no chest pain or shortness of breath, no cough.  Review of Systems  A complete 10 system review of systems was obtained and all systems are negative except as noted in the HPI and PMH.   Patient's Health History    Past Medical History:  Diagnosis Date  . Diabetes mellitus without complication (HCC)   . Hypertension   . Pseudotumor cerebri     Past Surgical History:  Procedure Laterality Date  . CERVICAL SPINE SURGERY    . CSF SHUNT    . KNEE SURGERY      No family history on file.  Social History   Socioeconomic History  . Marital status: Married    Spouse name: Not on file  . Number of children: Not on file  . Years of education: Not on file  . Highest education level: Not on file  Occupational History  . Not on file  Tobacco Use  . Smoking status: Never Smoker  . Smokeless tobacco: Never Used  Vaping Use  . Vaping Use: Never used  Substance and Sexual Activity  . Alcohol use: Never  . Drug use: Never  . Sexual activity: Not on file  Other Topics Concern  . Not on file  Social History Narrative  . Not on file   Social Determinants of Health   Financial Resource Strain: Not on file  Food Insecurity: Not on file  Transportation Needs: Not on file  Physical Activity: Not on file  Stress: Not on file  Social Connections:  Not on file  Intimate Partner Violence: Not on file     Physical Exam   Vitals:   03/30/20 2235 03/31/20 0239  BP: (!) 148/86 (!) 145/81  Pulse: (!) 111 90  Resp: 16 14  Temp: 97.9 F (36.6 C) 98.2 F (36.8 C)  SpO2: 97% 99%    CONSTITUTIONAL: Well-appearing, NAD NEURO:  Alert and oriented x 3, no focal deficits EYES:  eyes equal and reactive ENT/NECK:  no LAD, no JVD CARDIO: Regular rate, well-perfused, normal S1 and S2 PULM:  CTAB no wheezing or rhonchi GI/GU:  normal bowel sounds, non-distended, non-tender, right CVA tenderness MSK/SPINE:  No gross deformities, no edema SKIN:  no rash, atraumatic PSYCH:  Appropriate speech and behavior  *Additional and/or pertinent findings included in MDM below  Diagnostic and Interventional Summary    EKG Interpretation  Date/Time:    Ventricular Rate:    PR Interval:    QRS Duration:   QT Interval:    QTC Calculation:   R Axis:     Text Interpretation:        Labs Reviewed  RESP PANEL BY RT-PCR (FLU A&B, COVID) ARPGX2 - Abnormal; Notable for the following components:      Result Value   SARS Coronavirus 2 by RT PCR POSITIVE (*)  All other components within normal limits  BASIC METABOLIC PANEL - Abnormal; Notable for the following components:   Sodium 134 (*)    Chloride 93 (*)    Glucose, Bld 142 (*)    Creatinine, Ser 1.27 (*)    All other components within normal limits  CBG MONITORING, ED - Abnormal; Notable for the following components:   Glucose-Capillary 167 (*)    All other components within normal limits  CBC  URINALYSIS, ROUTINE W REFLEX MICROSCOPIC    No orders to display    Medications  sodium chloride 0.9 % bolus 1,000 mL (1,000 mLs Intravenous New Bag/Given 03/31/20 0202)  HYDROmorphone (DILAUDID) injection 1 mg (1 mg Intravenous Given 03/31/20 0157)     Procedures  /  Critical Care Procedures  ED Course and Medical Decision Making  I have reviewed the triage vital signs, the nursing notes, and  pertinent available records from the EMR.  Listed above are laboratory and imaging tests that I personally ordered, reviewed, and interpreted and then considered in my medical decision making (see below for details).  Initial concern for possible infected stone given that patient is endorsing fever up to 102 today with CT imaging demonstrating large 7 x 4 mm stone.  Patient's labs and urinalysis are reassuring, no evidence of infection, no leukocytosis.  Discussed case with [facility], given that he is not actively septic perhaps he is appropriate for close follow-up tomorrow morning.  Patient has tested positive for Covid, likely explaining the fever.  He is feeling better, appropriate for discharge with urology follow-up.  Will quarantine/isolate at home.       Elmer Sow. Pilar Plate, MD [facility] Emergency Medicine [facility] [email or web address]  Final Clinical Impressions(s) / ED Diagnoses     ICD-10-CM   1. COVID-19  U07.1   2. Kidney stone  N20.0     ED Discharge Orders    None       Discharge Instructions Discussed with and Provided to Patient:     Discharge Instructions     You were evaluated in the Emergency Department and after careful evaluation, we did not find any emergent condition requiring admission or further testing in the hospital.  Your symptoms seem to be due to a kidney stone as well as COVID-19 infection.  We discussed your case with the [facility] team, they should be expecting your call tomorrow morning to be seen in the office tomorrow.  Please return to the Emergency Department if you experience any worsening of your condition.  Thank you for allowing [place] to be a part of your care.       Sabas Sous, MD 03/31/20 [phone]

## 2020-03-31 NOTE — Discharge Instructions (Addendum)
You were evaluated in the Emergency Department and after careful evaluation, we did not find any emergent condition requiring admission or further testing in the hospital.  Your symptoms seem to be due to a kidney stone as well as COVID-19 infection.  We discussed your case with the [facility] team, they should be expecting your call tomorrow morning to be seen in the office tomorrow.  Please return to the Emergency Department if you experience any worsening of your condition.  Thank you for allowing [place] to be a part of your care.

## 2020-03-31 NOTE — Transfer of Care (Signed)
Immediate Anesthesia Transfer of Care Note  Patient: [name] [name]  Procedure(s) Performed: CYSTOSCOPY WITH RETROGRADE PYELOGRAM/URETERAL STENT PLACEMENT (Right )  Patient Location: PACU  Anesthesia Type:General  Level of Consciousness: awake and drowsy  Airway & Oxygen Therapy: Patient Spontanous Breathing and Patient connected to face mask oxygen  Post-op Assessment: Report given to RN and Post -op Vital signs reviewed and stable  Post vital signs: Reviewed and stable  Last Vitals:  Vitals Value Taken Time  BP 124/75 03/31/20 1920  Temp    Pulse 83 03/31/20 1921  Resp 10 03/31/20 1921  SpO2 100 % 03/31/20 1921  Vitals shown include unvalidated device data.  Last Pain:  Vitals:   03/31/20 1723  TempSrc: Oral  PainSc:          Complications: No complications documented.

## 2020-03-31 NOTE — Discharge Instructions (Signed)

## 2020-03-31 NOTE — ED Provider Notes (Signed)
[facility]-EMERGENCY DEPT Provider Note   CSN: [id number] Arrival date & time: 03/31/20  1351     History No chief complaint on file.   [name] [name] is a 50 y.o. male.  HPI Patient presents with right flank and abdominal pain.  Known 7 mm ureteral stone on right.  Pain uncontrolled at home.  Has been on oxycodone.  Has discussed with Dr. Laverle Patter from urology.  Recommended to come to the ER for stenting.  Patient however has had fevers.  Positive Covid test yesterday.  No urinary tract infection.  No respiratory complaints.  States fevers been going for around 2 weeks as has the pain in the flank.    Past Medical History:  Diagnosis Date  . Diabetes mellitus without complication (HCC)   . Hypertension   . Pseudotumor cerebri     Patient Active Problem List   Diagnosis Date Noted  . History of pseudoseizure 08/28/2019  . Multiple thyroid nodules 06/05/2018  . Acquired hypothyroidism 06/05/2018  . Pain medication agreement signed 01/03/2017  . Post laminectomy syndrome 04/01/2015  . Pseudotumor cerebri 04/01/2015  . ADHD (attention deficit hyperactivity disorder), combined type 10/02/2014  . Depression, major, in remission (HCC) 10/02/2014  . Snoring 07/15/2014  . Long term current use of opiate analgesic 01/16/2013  . Hyperlipidemia 08/31/2012  . Type II diabetes mellitus (HCC) 08/31/2012  . Headache 04/12/2012  . Chronic pain 08/24/2011  . DEPRESSION 11/03/2006  . HYPERTENSION 11/03/2006    Past Surgical History:  Procedure Laterality Date  . CERVICAL SPINE SURGERY    . CSF SHUNT    . KNEE SURGERY         History reviewed. No pertinent family history.  Social History   Tobacco Use  . Smoking status: Never Smoker  . Smokeless tobacco: Never Used  Vaping Use  . Vaping Use: Never used  Substance Use Topics  . Alcohol use: Never  . Drug use: Never    Home Medications Prior to Admission medications   Medication Sig Start Date End  Date Taking? Authorizing Provider  amLODipine (NORVASC) 10 MG tablet Take 10 mg by mouth. 10/05/08   [provider]  diclofenac (FLECTOR) 1.3 % PTCH Place 1 patch onto the skin daily. 08/28/19   Asencion Islam, DPM  metFORMIN (GLUCOPHAGE) 500 MG tablet TK 1 T PO  BID 04/24/18   [provider]  methadone (DOLOPHINE) 10 MG tablet TK 1 T PO QID AND 1 AND 1/2 T HS 05/10/18   [provider]  olmesartan-hydrochlorothiazide (BENICAR HCT) 40-25 MG tablet Take 1 tablet by mouth daily. 05/19/18   [provider]  ONGLYZA 5 MG TABS tablet TK 1 T PO QD 05/01/18   [provider]  Testosterone Cypionate 200 MG/ML SOLN Inject into the muscle. 08/15/07   [provider]  Vitamin D, Ergocalciferol, (DRISDOL) 1.25 MG (50000 UT) CAPS capsule TK ONE C PO Q WEEK 03/22/18   [provider]    Allergies    Amoxicillin, Benzodiazepines, and Acetazolamide  Review of Systems   Review of Systems  Constitutional: Positive for fever. Negative for appetite change.  HENT: Negative for congestion.   Respiratory: Negative for shortness of breath.   Cardiovascular: Negative for chest pain.  Gastrointestinal: Positive for abdominal pain and nausea.  Genitourinary: Negative for flank pain.  Musculoskeletal: Negative for back pain.  Skin: Negative for rash.  Neurological: Negative for weakness.  Psychiatric/Behavioral: Negative for confusion.    Physical Exam Updated Vital  Signs BP (!) 165/82   Pulse 96   Temp 98.3 F (36.8 C) (Oral)   Resp 16   Ht 5\' 6"  (1.676 m)   Wt 90.7 kg   SpO2 99%   BMI 32.27 kg/m   Physical Exam Vitals and nursing note reviewed.  Constitutional:      Appearance: Normal appearance.     Comments: Patient appears uncomfortable  HENT:     Head: Normocephalic.  Eyes:     Extraocular Movements: Extraocular movements intact.     Pupils: Pupils are equal, round, and reactive to light.  Cardiovascular:     Rate and Rhythm:  Normal rate and regular rhythm.  Pulmonary:     Breath sounds: Normal breath sounds.  Abdominal:     Comments: Mild right-sided tenderness.  No hernias.  No rebound or guarding.  Genitourinary:    Comments: Right CVA tenderness. Musculoskeletal:        General: No tenderness.     Cervical back: Neck supple.  Skin:    General: Skin is warm.     Capillary Refill: Capillary refill takes less than 2 seconds.  Neurological:     Mental Status: He is alert and oriented to person, place, and time.     ED Results / Procedures / Treatments   Labs (all labs ordered are listed, but only abnormal results are displayed) Labs Reviewed - No data to display  EKG None  Radiology No results found.  Procedures Procedures (including critical care time)  Medications Ordered in ED Medications  HYDROmorphone (DILAUDID) injection 1 mg (1 mg Intravenous Given 03/31/20 1451)    ED Course  I have reviewed the triage vital signs and the nursing notes.  Pertinent labs & imaging results that were available during my care of the patient were reviewed by me and considered in my medical decision making (see chart for details).    MDM Rules/Calculators/A&P                          Patient with ureteral stone on right.  Known Covid infection.  Pain uncontrolled as an outpatient.  Discussed with Dr. 05/29/20 who will likely take the OR.  Last ate around noon.  Instructed not to eat anymore. Final Clinical Impression(s) / ED Diagnoses Final diagnoses:  Kidney stone on right side  COVID-19    Rx / DC Orders ED Discharge Orders    None       Laverle Patter, MD 03/31/20 1540

## 2020-03-31 NOTE — ED Triage Notes (Signed)
Dr. Verlee Rossetti wants pt here for surgery. Pt has 24mm kidney stone. COVID positive from last night at [facility].

## 2020-03-31 NOTE — Anesthesia Postprocedure Evaluation (Signed)
Anesthesia Post Note  Patient: [name] [name]  Procedure(s) Performed: CYSTOSCOPY WITH RETROGRADE PYELOGRAM/URETERAL STENT PLACEMENT (Right )     Patient location during evaluation: PACU Anesthesia Type: General Level of consciousness: awake and alert Pain management: pain level controlled Vital Signs Assessment: post-procedure vital signs reviewed and stable Respiratory status: spontaneous breathing, nonlabored ventilation and respiratory function stable Cardiovascular status: blood pressure returned to baseline and stable Postop Assessment: no apparent nausea or vomiting Anesthetic complications: no   No complications documented.  Last Vitals:  Vitals:   03/31/20 1940 03/31/20 1945  BP:    Pulse: 94   Resp: 15   Temp:  36.9 C  SpO2: 99%     Last Pain:  Vitals:   03/31/20 1945  TempSrc:   PainSc: 0-No pain                 Zoelle Markus,W. EDMOND

## 2020-03-31 NOTE — Consult Note (Signed)
Urology Consult   Physician requesting consult: Dr. Rubin Payor  Reason for consult: Right ureteral stone and fever  History of Present Illness: [name] [name] is a 50 y.o. who presented to the ED last night with 2 weeks of intermittent severe, right sided flank pain.  CT imaging at [facility] demonstrated a 7 mm proximal ureteral stone.  He was noted to have fever to 102 but was found to be COVID positive with imaging suggesting possible pneumonia.  His UA was completely negative.  He was told to f/u with Urology.  He attempted to present to our office today but was unable to be seen in the office due to his COVID status.  His pain was not controlled and I recommended he present to the [facility] ED for further evaluation.  He is on chronic methadone due to pain related to pseudotumor cerebri and oxycodone has not been sufficient to control his pain with oral pain medication.  He has not been febrile today.  He denies SOB, cough, etc.  He does not feel that he can wait to complete quarantine due to his uncontrolled pain.    Past Medical History:  Diagnosis Date  . Diabetes mellitus without complication (HCC)   . Hypertension   . Pseudotumor cerebri     Past Surgical History:  Procedure Laterality Date  . CERVICAL SPINE SURGERY    . CSF SHUNT    . KNEE SURGERY      Current Hospital Medications:  Home Meds:  No current facility-administered medications on file prior to encounter.   Current Outpatient Medications on File Prior to Encounter  Medication Sig Dispense Refill  . amLODipine (NORVASC) 10 MG tablet Take 10 mg by mouth.    . diclofenac (FLECTOR) 1.3 % PTCH Place 1 patch onto the skin daily. 60 patch 0  . metFORMIN (GLUCOPHAGE) 500 MG tablet TK 1 T PO  BID    . methadone (DOLOPHINE) 10 MG tablet TK 1 T PO QID AND 1 AND 1/2 T HS    . olmesartan-hydrochlorothiazide (BENICAR HCT) 40-25 MG tablet Take 1 tablet by mouth daily.    . ONGLYZA 5 MG TABS tablet TK 1 T PO QD    .  Testosterone Cypionate 200 MG/ML SOLN Inject into the muscle.    . Vitamin D, Ergocalciferol, (DRISDOL) 1.25 MG (50000 UT) CAPS capsule TK ONE C PO Q WEEK       Scheduled Meds: Continuous Infusions: PRN Meds:.  Allergies:  Allergies  Allergen Reactions  . Amoxicillin Hives  . Benzodiazepines Other (See Comments)    Other reaction(s): Other (See Comments) Other Reaction: makes him feel/act intoxicated Other Reaction: makes him feel/act intoxicated   . Acetazolamide Other (See Comments), Rash and Swelling    Other Reaction: redness    History reviewed. No pertinent family history.  Social History:  reports that he has never smoked. He has never used smokeless tobacco. He reports that he does not drink alcohol and does not use drugs.  ROS: A complete review of systems was performed.  All systems are negative except for pertinent findings as noted.  Physical Exam:  Vital signs in last 24 hours: Temp:  [97.9 F (36.6 C)-98.3 F (36.8 C)] 98.3 F (36.8 C) (01/05 1412) Pulse Rate:  [88-111] 96 (01/05 1412) Resp:  [11-16] 16 (01/05 1412) BP: (136-165)/(81-90) 165/82 (01/05 1412) SpO2:  [95 %-99 %] 99 % (01/05 1412) Weight:  [90.7 kg] 90.7 kg (01/05 1404) Constitutional:  Alert and oriented, No acute  distress Cardiovascular: Regular rate and rhythm, No JVD Respiratory: Normal respiratory effort, Lungs clear bilaterally GI: Abdomen is tender over right upper and lower quadrant GU: Moderate right CVA tenderness Lymphatic: No lymphadenopathy Neurologic: Grossly intact, no focal deficits Psychiatric: Normal mood and affect  Laboratory Data:  Recent Labs    03/30/20 2240  WBC 8.9  HGB 14.0  HCT 40.4  PLT 230    Recent Labs    03/30/20 2240  NA 134*  K 3.5  CL 93*  GLUCOSE 142*  BUN 16  CALCIUM 9.2  CREATININE 1.27*     Results for orders placed or performed during the hospital encounter of 03/31/20 (from the past 24 hour(s))  CBC     Status: None    Collection Time: 03/30/20 10:40 PM  Result Value Ref Range   WBC 8.9 4.0 - 10.5 K/uL   RBC 4.73 4.22 - 5.81 MIL/uL   Hemoglobin 14.0 13.0 - 17.0 g/dL   HCT 03.4 74.2 - 59.5 %   MCV 85.4 80.0 - 100.0 fL   MCH 29.6 26.0 - 34.0 pg   MCHC 34.7 30.0 - 36.0 g/dL   RDW 63.8 75.6 - 43.3 %   Platelets 230 150 - 400 K/uL   nRBC 0.0 0.0 - 0.2 %  Basic metabolic panel     Status: Abnormal   Collection Time: 03/30/20 10:40 PM  Result Value Ref Range   Sodium 134 (L) 135 - 145 mmol/L   Potassium 3.5 3.5 - 5.1 mmol/L   Chloride 93 (L) 98 - 111 mmol/L   CO2 28 22 - 32 mmol/L   Glucose, Bld 142 (H) 70 - 99 mg/dL   BUN 16 6 - 20 mg/dL   Creatinine, Ser 2.95 (H) 0.61 - 1.24 mg/dL   Calcium 9.2 8.9 - 18.8 mg/dL   GFR, Estimated >41 >66 mL/min   Anion gap 13 5 - 15  Urinalysis, Routine w reflex microscopic     Status: None   Collection Time: 03/30/20 10:40 PM  Result Value Ref Range   Color, Urine YELLOW YELLOW   APPearance CLEAR CLEAR   Specific Gravity, Urine 1.015 1.005 - 1.030   pH 5.0 5.0 - 8.0   Glucose, UA NEGATIVE NEGATIVE mg/dL   Hgb urine dipstick NEGATIVE NEGATIVE   Bilirubin Urine NEGATIVE NEGATIVE   Ketones, ur NEGATIVE NEGATIVE mg/dL   Protein, ur NEGATIVE NEGATIVE mg/dL   Nitrite NEGATIVE NEGATIVE   Leukocytes,Ua NEGATIVE NEGATIVE  Resp Panel by RT-PCR (Flu A&B, Covid) Nasopharyngeal Swab     Status: Abnormal   Collection Time: 03/31/20  2:42 AM   Specimen: Nasopharyngeal Swab; Nasopharyngeal(NP) swabs in vial transport medium  Result Value Ref Range   SARS Coronavirus 2 by RT PCR POSITIVE (A) NEGATIVE   Influenza A by PCR NEGATIVE NEGATIVE   Influenza B by PCR NEGATIVE NEGATIVE  CBG monitoring, ED     Status: Abnormal   Collection Time: 03/31/20  3:24 AM  Result Value Ref Range   Glucose-Capillary 167 (H) 70 - 99 mg/dL   Recent Results (from the past 240 hour(s))  Resp Panel by RT-PCR (Flu A&B, Covid) Nasopharyngeal Swab     Status: Abnormal   Collection Time: 03/31/20   2:42 AM   Specimen: Nasopharyngeal Swab; Nasopharyngeal(NP) swabs in vial transport medium  Result Value Ref Range Status   SARS Coronavirus 2 by RT PCR POSITIVE (A) NEGATIVE Final    Comment: RESULT CALLED TO, READ BACK BY AND VERIFIED WITH: NEAL,K AT 0630  ON [id number] BY CHERESNOWSKY,T (NOTE) SARS-CoV-2 target nucleic acids are DETECTED.  The SARS-CoV-2 RNA is generally detectable in upper respiratory specimens during the acute phase of infection. Positive results are indicative of the presence of the identified virus, but do not rule out bacterial infection or co-infection with other pathogens not detected by the test. Clinical correlation with patient history and other diagnostic information is necessary to determine patient infection status. The expected result is Negative.  Fact Sheet for Patients: EntrepreneurPulse.com.au  Fact Sheet for Healthcare Providers: IncredibleEmployment.be  This test is not yet approved or cleared by the [place] FDA and  has been authorized for detection and/or diagnosis of SARS-CoV-2 by FDA under an Emergency Use Authorization (EUA).  This EUA will remain in effect (meaning this te st can be used) for the duration of  the COVID-19 declaration under Section 564(b)(1) of the Act, 21 U.S.C. section 360bbb-3(b)(1), unless the authorization is terminated or revoked sooner.     Influenza A by PCR NEGATIVE NEGATIVE Final   Influenza B by PCR NEGATIVE NEGATIVE Final    Comment: (NOTE) The Xpert Xpress SARS-CoV-2/FLU/RSV plus assay is intended as an aid in the diagnosis of influenza from Nasopharyngeal swab specimens and should not be used as a sole basis for treatment. Nasal washings and aspirates are unacceptable for Xpert Xpress SARS-CoV-2/FLU/RSV testing.  Fact Sheet for Patients: EntrepreneurPulse.com.au  Fact Sheet for Healthcare  Providers: IncredibleEmployment.be  This test is not yet approved or cleared by the [place] FDA and has been authorized for detection and/or diagnosis of SARS-CoV-2 by FDA under an Emergency Use Authorization (EUA). This EUA will remain in effect (meaning this test can be used) for the duration of the COVID-19 declaration under Section 564(b)(1) of the Act, 21 U.S.C. section 360bbb-3(b)(1), unless the authorization is terminated or revoked.  Performed at [facility], [address] [place], [place] [address]     Renal Function: Recent Labs    03/30/20 2240  CREATININE 1.27*   Estimated Creatinine Clearance: 74.2 mL/min (A) (by C-G formula based on SCr of 1.27 mg/dL (H)).  Radiologic Imaging: His outside CT from [facility] was reviewed with a 7 mm right proximal ureteral stone and multiple left renal calculi.  I independently reviewed the above imaging studies.  Impression/Recommendation Right ureteral stone:  Considering his fever and uncontrolled pain, he requires ureteral stent placement despite his COVID diagnosis.  Will proceed with cystoscopy and right ureteral stent placement with plans to then have him follow up after he completes his full quarantine to discuss definitive management. I discussed the potential benefits and risks of the procedure, side effects of the proposed treatment, the likelihood of the patient achieving the goals of the procedure, and any potential problems that might occur during the procedure or recuperation.  If no concern for obvious GU infection intraoperatively, he will be discharged home following his procedure.  Dutch Gray 03/31/2020, 4:01 PM    Pryor Curia MD   CC: Dr. Alvino Chapel

## 2020-03-31 NOTE — Anesthesia Preprocedure Evaluation (Addendum)
Anesthesia Evaluation  Patient identified by MRN, date of birth, ID band Patient awake    Reviewed: Allergy & Precautions, H&P , NPO status , Patient's Chart, lab work & pertinent test results  Airway Mallampati: I  TM Distance: >3 FB Neck ROM: Full    Dental no notable dental hx. (+) Teeth Intact, Dental Advisory Given   Pulmonary neg pulmonary ROS,    Pulmonary exam normal breath sounds clear to auscultation       Cardiovascular hypertension, Pt. on medications  Rhythm:Regular Rate:Normal     Neuro/Psych  Headaches, Depression    GI/Hepatic negative GI ROS, Neg liver ROS,   Endo/Other  diabetes, Type 2, Oral Hypoglycemic AgentsHypothyroidism   Renal/GU negative Renal ROS  negative genitourinary   Musculoskeletal   Abdominal   Peds  Hematology negative hematology ROS (+)   Anesthesia Other Findings   Reproductive/Obstetrics negative OB ROS                            Anesthesia Physical Anesthesia Plan  ASA: II and emergent  Anesthesia Plan: General   Post-op Pain Management:    Induction: Intravenous, Rapid sequence and Cricoid pressure planned  PONV Risk Score and Plan: 3 and Ondansetron, Midazolam and Treatment may vary due to age or medical condition  Airway Management Planned: Oral ETT  Additional Equipment:   Intra-op Plan:   Post-operative Plan: Extubation in OR  Informed Consent: I have reviewed the patients History and Physical, chart, labs and discussed the procedure including the risks, benefits and alternatives for the proposed anesthesia with the patient or authorized representative who has indicated his/her understanding and acceptance.     Dental advisory given  Plan Discussed with: CRNA  Anesthesia Plan Comments:        Anesthesia Quick Evaluation

## 2020-04-01 ENCOUNTER — Encounter (HOSPITAL_COMMUNITY): Payer: Self-pay | Admitting: Urology

## 2020-04-09 ENCOUNTER — Other Ambulatory Visit (HOSPITAL_COMMUNITY): Payer: Medicare Other

## 2020-04-09 NOTE — Progress Notes (Signed)
Patient to arrive at 1030 on 04/12/2020. Positive covid test on 03/31/2020. No re-test required. History and medications reviewed. NPO after MN except for clear liquids until 0830. Instructed to take BP medications only, on morning of procedure. Consulted with [facility] regarding [facility]adone. Instructed to tell patient not to take AM dose. Patient takes this medication for pseudotumor cerebri not for substance abuse. Driver secured.

## 2020-04-12 ENCOUNTER — Other Ambulatory Visit: Payer: Self-pay

## 2020-04-12 ENCOUNTER — Encounter (HOSPITAL_BASED_OUTPATIENT_CLINIC_OR_DEPARTMENT_OTHER): Payer: Self-pay | Admitting: Urology

## 2020-04-12 ENCOUNTER — Ambulatory Visit (HOSPITAL_COMMUNITY): Payer: Medicare Other

## 2020-04-12 ENCOUNTER — Encounter (HOSPITAL_BASED_OUTPATIENT_CLINIC_OR_DEPARTMENT_OTHER)
Admission: RE | Disposition: A | Discharge status: Home / Self Care | Payer: Self-pay | Source: Home / Self Care | Attending: Urology

## 2020-04-12 ENCOUNTER — Ambulatory Visit (HOSPITAL_BASED_OUTPATIENT_CLINIC_OR_DEPARTMENT_OTHER)
Admission: RE | Admit: 2020-04-12 | Discharge: 2020-04-12 | Disposition: A | Discharge status: Home / Self Care | Payer: Medicare Other | Attending: Urology | Admitting: Urology

## 2020-04-12 DIAGNOSIS — N200 Calculus of kidney: Secondary | ICD-10-CM | POA: Insufficient documentation

## 2020-04-12 DIAGNOSIS — Z880 Allergy status to penicillin: Secondary | ICD-10-CM | POA: Insufficient documentation

## 2020-04-12 DIAGNOSIS — Z888 Allergy status to other drugs, medicaments and biological substances status: Secondary | ICD-10-CM | POA: Insufficient documentation

## 2020-04-12 DIAGNOSIS — E119 Type 2 diabetes mellitus without complications: Secondary | ICD-10-CM | POA: Diagnosis not present

## 2020-04-12 DIAGNOSIS — N201 Calculus of ureter: Secondary | ICD-10-CM

## 2020-04-12 HISTORY — PX: EXTRACORPOREAL SHOCK WAVE LITHOTRIPSY: SHX1557

## 2020-04-12 LAB — GLUCOSE, CAPILLARY: Glucose-Capillary: 170 mg/dL — ABNORMAL HIGH (ref 70–99)

## 2020-04-12 SURGERY — LITHOTRIPSY, ESWL
Anesthesia: LOCAL | Laterality: Right

## 2020-04-12 MED ORDER — DIAZEPAM 5 MG PO TABS
10.0000 mg | ORAL_TABLET | ORAL | Status: AC
  Administered 2020-04-12: 10 mg via ORAL

## 2020-04-12 MED ORDER — DIAZEPAM 5 MG PO TABS
ORAL_TABLET | ORAL | Status: AC
  Filled 2020-04-12: qty 2

## 2020-04-12 MED ORDER — OXYCODONE-ACETAMINOPHEN 5-325 MG PO TABS: 1.0000 | ORAL_TABLET | Freq: Four times a day (QID) | ORAL | 0 refills | Status: AC | PRN

## 2020-04-12 MED ORDER — DIPHENHYDRAMINE HCL 25 MG PO CAPS
ORAL_CAPSULE | ORAL | Status: AC
  Filled 2020-04-12: qty 1

## 2020-04-12 MED ORDER — DIPHENHYDRAMINE HCL 25 MG PO CAPS
25.0000 mg | ORAL_CAPSULE | ORAL | Status: AC
  Administered 2020-04-12: 25 mg via ORAL

## 2020-04-12 MED ORDER — SODIUM CHLORIDE 0.9 % IV SOLN: INTRAVENOUS | Status: DC

## 2020-04-12 MED ORDER — TAMSULOSIN HCL 0.4 MG PO CAPS: 0.4000 mg | ORAL_CAPSULE | Freq: Every day | ORAL | 0 refills | Status: AC

## 2020-04-12 NOTE — Brief Op Note (Signed)
04/12/2020  1:26 PM  PATIENT:  [name] [name]  50 y.o. male  PRE-OPERATIVE DIAGNOSIS:  RIGHT RENAL STONE  POST-OPERATIVE DIAGNOSIS:  * No post-op diagnosis entered *  PROCEDURE:  Procedure(s): RIGHT EXTRACORPOREAL SHOCK WAVE LITHOTRIPSY (ESWL) (Right)  SURGEON:  Surgeon(s) and Role:    * Alexis Frock, MD - Primary  PHYSICIAN ASSISTANT:   ASSISTANTS: none   ANESTHESIA:   MAC  EBL:  minimal   BLOOD ADMINISTERED:none  DRAINS: none   LOCAL MEDICATIONS USED:  NONE  SPECIMEN:  No Specimen  DISPOSITION OF SPECIMEN:  N/A  COUNTS:  YES  TOURNIQUET:  * No tourniquets in log *  DICTATION: .Note written in paper chart  PLAN OF CARE: Discharge to home after PACU  PATIENT DISPOSITION:  PACU - hemodynamically stable.   Delay start of Pharmacological VTE agent (>24hrs) due to surgical blood loss or risk of bleeding: not applicable

## 2020-04-12 NOTE — Discharge Instructions (Signed)
1 - You may have urinary urgency (bladder spasms), pass small stone fragments, bloody urine on / off for up to 3 weeks. This is normal.  2 - Call MD or go to ER for fever >102, severe pain / nausea / vomiting not relieved by medications, or acute change in medical status    Post Anesthesia Home Care Instructions  Activity: Get plenty of rest for the remainder of the day. A responsible individual must stay with you for 24 hours following the procedure.  For the next 24 hours, DO NOT: -Drive a car -Advertising copywriter -Drink alcoholic beverages -Take any medication unless instructed by your physician -Make any legal decisions or sign important papers.  Meals: Start with liquid foods such as gelatin or soup. Progress to regular foods as tolerated. Avoid greasy, spicy, heavy foods. If nausea and/or vomiting occur, drink only clear liquids until the nausea and/or vomiting subsides. Call your physician if vomiting continues.  Special Instructions/Symptoms: Your throat may feel dry or sore from the anesthesia or the breathing tube placed in your throat during surgery. If this causes discomfort, gargle with warm salt water. The discomfort should disappear within 24 hours.

## 2020-04-12 NOTE — H&P (Signed)
[name] [name] is an 50 y.o. male.    Chief Complaint: Pre-OP RIGHT Shockwave lithotripsy  HPI:   1 - RIGHT Renal / Ureteral Stones - 69mm Rt mid ureteral stone (L3-4 interspace) by CT 03/2020 on eval flank pain, also Rt upper renal stone as well. Underwent stentint 03/31/20 as temporizine measure in setting of equivocal infectious parameters and refractory colic.  PMH sig for C19 (s/p quarantene), CSF shunt / methadone for Psudotumor. Most recent UA without infectious parameters. No interval fevers.  Today "[name]" is seen to proceed with RIGHT shockwave lithotripsy for Rt mid ureteral stone.   Past Medical History:  Diagnosis Date  . Diabetes mellitus without complication (HCC)   . Hypertension   . Pseudotumor cerebri     Past Surgical History:  Procedure Laterality Date  . CERVICAL SPINE SURGERY    . CSF SHUNT    . CYSTOSCOPY W/ URETERAL STENT PLACEMENT Right 03/31/2020   Procedure: CYSTOSCOPY WITH RETROGRADE PYELOGRAM/URETERAL STENT PLACEMENT;  Surgeon: Heloise Purpura, MD;  Location: [facility] ORS;  Service: Urology;  Laterality: Right;  . KNEE SURGERY      No family history on file. Social History:  reports that he has never smoked. He has never used smokeless tobacco. He reports that he does not drink alcohol and does not use drugs.  Allergies:  Allergies  Allergen Reactions  . Amoxicillin Hives  . Benzodiazepines Other (See Comments)    Other reaction(s): Other (See Comments) Other Reaction: makes him feel/act intoxicated Other Reaction: makes him feel/act intoxicated   . Acetazolamide Other (See Comments), Rash and Swelling    Other Reaction: redness    No medications prior to admission.    No results found for this or any previous visit (from the past 48 hour(s)). No results found.  Review of Systems  Constitutional: Negative for chills and fatigue.  Respiratory: Negative for cough and shortness of breath.   Genitourinary: Positive for urgency.  All other systems  reviewed and are negative.   There were no vitals taken for this visit. Physical Exam HENT:     Nose: Nose normal.  Eyes:     Pupils: Pupils are equal, round, and reactive to light.  Cardiovascular:     Pulses: Normal pulses.  Abdominal:     General: Abdomen is flat.  Genitourinary:    Comments: No CVAT at present.  Musculoskeletal:        General: Normal range of motion.     Cervical back: Normal range of motion.  Skin:    General: Skin is warm.  Neurological:     General: No focal deficit present.     Mental Status: He is alert.  Psychiatric:        Mood and Affect: Mood normal.      Assessment/Plan  Proceed as planned with RIGHT shockwave lithotripsy. Risks, benefits, alternatives, expected peri-treatment course discussed previously and reiterated today. Some additiona percocet post-op as he is expected to have seom acute on chronic pain.   Sebastian Ache, MD 04/12/2020, 10:14 AM

## 2020-04-13 ENCOUNTER — Encounter (HOSPITAL_BASED_OUTPATIENT_CLINIC_OR_DEPARTMENT_OTHER): Payer: Self-pay | Admitting: Urology

## 2020-04-13 ENCOUNTER — Encounter (HOSPITAL_BASED_OUTPATIENT_CLINIC_OR_DEPARTMENT_OTHER): Payer: Self-pay | Admitting: Certified Registered Nurse Anesthetist

## 2020-04-20 ENCOUNTER — Other Ambulatory Visit: Payer: Self-pay | Admitting: Urology

## 2020-04-20 NOTE — Progress Notes (Signed)
DUE TO COVID-19 ONLY ONE VISITOR IS ALLOWED TO COME WITH YOU AND STAY IN THE WAITING ROOM ONLY DURING PRE OP AND PROCEDURE DAY OF SURGERY. THE 1 VISITOR  MAY VISIT WITH YOU AFTER SURGERY IN YOUR PRIVATE ROOM DURING VISITING HOURS ONLY!  YOU NEED TO HAVE A COVID 19 TEST ON_______ @_______ , THIS TEST MUST BE DONE BEFORE SURGERY,  COVID TESTING SITE [address] [place] , IT IS ON THE RIGHT GOING OUT [address] APPROXIMATELY  2 MINUTES PAST [employer] ON THE RIGHT. ONCE YOUR COVID TEST IS COMPLETED,  PLEASE BEGIN THE QUARANTINE INSTRUCTIONS AS OUTLINED IN YOUR HANDOUT.                [id number] [address]  04/20/2020   Your procedure is scheduled on: 05/03/2020    Report to [facility] Main  Entrance   Report to admitting at   115pm     Call this number if you have problems the morning of surgery [phone]    Remember: Do not eat food , candy gum or mints :After Midnight. You may have clear liquids from midnight until 1215pm    CLEAR LIQUID DIET   Foods Allowed                                                                       Coffee and tea, regular and decaf                              Plain Jell-O any favor except red or purple                                            Fruit ices (not with fruit pulp)                                      Iced Popsicles                                     Carbonated beverages, regular and diet                                    Cranberry, grape and apple juices Sports drinks like Gatorade Lightly seasoned clear broth or consume(fat free) Sugar, honey syrup   _____________________________________________________________________    BRUSH YOUR TEETH MORNING OF SURGERY AND RINSE YOUR MOUTH OUT, NO CHEWING GUM CANDY OR MINTS.     Take these medicines the morning of surgery with A SIP OF WATER: amlodipine, methadone   DO NOT TAKE ANY DIABETIC MEDICATIONS DAY OF YOUR SURGERY                                You may not have any metal on your body including hair  pins and              piercings  Do not wear jewelry, make-up, lotions, powders or perfumes, deodorant             Do not wear nail polish on your fingernails.  Do not shave  48 hours prior to surgery.              Men may shave face and neck.   Do not bring valuables to the hospital. [facility] IS NOT             RESPONSIBLE   FOR VALUABLES.  Contacts, dentures or bridgework may not be worn into surgery.  Leave suitcase in the car. After surgery it may be brought to your room.     Patients discharged the day of surgery will not be allowed to drive home. IF YOU ARE HAVING SURGERY AND GOING HOME THE SAME DAY, YOU MUST HAVE AN ADULT TO DRIVE YOU HOME AND BE WITH YOU FOR 24 HOURS. YOU MAY GO HOME BY TAXI OR UBER OR ORTHERWISE, BUT AN ADULT MUST ACCOMPANY YOU HOME AND STAY WITH YOU FOR 24 HOURS.  Name and phone number of your driver:  Special Instructions: N/A              Please read over the following fact sheets you were given: _____________________________________________________________________  [facility] - Preparing for Surgery Before surgery, you can play an important role.  Because skin is not sterile, your skin needs to be as free of germs as possible.  You can reduce the number of germs on your skin by washing with CHG (chlorahexidine gluconate) soap before surgery.  CHG is an antiseptic cleaner which kills germs and bonds with the skin to continue killing germs even after washing. Please DO NOT use if you have an allergy to CHG or antibacterial soaps.  If your skin becomes reddened/irritated stop using the CHG and inform your nurse when you arrive at Short Stay. Do not shave (including legs and underarms) for at least 48 hours prior to the first CHG shower.  You may shave your face/neck. Please follow these instructions carefully:  1.  Shower with CHG Soap the night before surgery and the  morning of Surgery.  2.  If you choose  to wash your hair, wash your hair first as usual with your  normal  shampoo.  3.  After you shampoo, rinse your hair and body thoroughly to remove the  shampoo.                           4.  Use CHG as you would any other liquid soap.  You can apply chg directly  to the skin and wash                       Gently with a scrungie or clean washcloth.  5.  Apply the CHG Soap to your body ONLY FROM THE NECK DOWN.   Do not use on face/ open                           Wound or open sores. Avoid contact with eyes, ears mouth and genitals (private parts).                       Wash face,  Genitals (private parts) with your  normal soap.             6.  Wash thoroughly, paying special attention to the area where your surgery  will be performed.  7.  Thoroughly rinse your body with warm water from the neck down.  8.  DO NOT shower/wash with your normal soap after using and rinsing off  the CHG Soap.                9.  Pat yourself dry with a clean towel.            10.  Wear clean pajamas.            11.  Place clean sheets on your bed the night of your first shower and do not  sleep with pets. Day of Surgery : Do not apply any lotions/deodorants the morning of surgery.  Please wear clean clothes to the hospital/surgery center.  FAILURE TO FOLLOW THESE INSTRUCTIONS MAY RESULT IN THE CANCELLATION OF YOUR SURGERY PATIENT SIGNATURE_________________________________  NURSE SIGNATURE__________________________________  ________________________________________________________________________

## 2020-04-23 ENCOUNTER — Encounter (HOSPITAL_COMMUNITY): Payer: Self-pay

## 2020-04-23 ENCOUNTER — Encounter (HOSPITAL_COMMUNITY)
Admission: RE | Admit: 2020-04-23 | Discharge: 2020-04-23 | Disposition: A | Discharge status: Home / Self Care | Payer: Medicare Other | Source: Ambulatory Visit | Attending: Urology | Admitting: Urology

## 2020-04-23 ENCOUNTER — Other Ambulatory Visit: Payer: Self-pay

## 2020-04-23 DIAGNOSIS — Z01818 Encounter for other preprocedural examination: Secondary | ICD-10-CM | POA: Diagnosis present

## 2020-04-23 HISTORY — DX: Sleep apnea, unspecified: G47.30

## 2020-04-23 HISTORY — DX: Personal history of other endocrine, nutritional and metabolic disease: Z86.39

## 2020-04-23 HISTORY — DX: Unspecified osteoarthritis, unspecified site: M19.90

## 2020-04-23 HISTORY — DX: Personal history of urinary calculi: Z87.442

## 2020-04-23 LAB — CBC
HCT: 39.3 % (ref 39.0–52.0)
Hemoglobin: 13.7 g/dL (ref 13.0–17.0)
MCH: 30.3 pg (ref 26.0–34.0)
MCHC: 34.9 g/dL (ref 30.0–36.0)
MCV: 86.9 fL (ref 80.0–100.0)
Platelets: 203 10*3/uL (ref 150–400)
RBC: 4.52 MIL/uL (ref 4.22–5.81)
RDW: 11.7 % (ref 11.5–15.5)
WBC: 6.3 10*3/uL (ref 4.0–10.5)
nRBC: 0 % (ref 0.0–0.2)

## 2020-04-23 LAB — BASIC METABOLIC PANEL
Anion gap: 10 (ref 5–15)
BUN: 16 mg/dL (ref 6–20)
CO2: 29 mmol/L (ref 22–32)
Calcium: 9.5 mg/dL (ref 8.9–10.3)
Chloride: 99 mmol/L (ref 98–111)
Creatinine, Ser: 0.53 mg/dL — ABNORMAL LOW (ref 0.61–1.24)
GFR, Estimated: >60 mL/min (ref >60)
Glucose, Bld: 195 mg/dL — ABNORMAL HIGH (ref 70–99)
Potassium: 3.7 mmol/L (ref 3.5–5.1)
Sodium: 138 mmol/L (ref 135–145)

## 2020-04-23 LAB — HEMOGLOBIN A1C
Hgb A1c MFr Bld: 7.3 % — ABNORMAL HIGH (ref 4.8–5.6)
Mean Plasma Glucose: 162.81 mg/dL

## 2020-04-23 LAB — GLUCOSE, CAPILLARY: Glucose-Capillary: 208 mg/dL — ABNORMAL HIGH (ref 70–99)

## 2020-04-23 NOTE — Progress Notes (Addendum)
COVID Vaccine Completed: No Date COVID Vaccine completed: No COVID vaccine manufacturer: N/A  PCP - Fleet Contras, MD Cardiologist - N/A  Chest x-ray - N/A EKG - 01/2020 received and placed in chart Stress Test - greater than 2 years ECHO - N/A Cardiac Cath - N/A Pacemaker/ICD device last checked:N/A  Sleep Study - Yes CPAP - no ordered mild cases  Fasting Blood Sugar - 118--120's Checks Blood Sugar __5___ times a week  Blood Thinner Instructions: N/A Aspirin Instructions: N/A Last Dose: N/A  Activity level:    Able to exercise without symptoms     Anesthesia review: N/A  Patient denies shortness of breath, fever, cough and chest pain at PAT appointment   Patient verbalized understanding of instructions that were given to them at the PAT appointment. Patient was also instructed that they will need to review over the PAT instructions again at home before surgery.

## 2020-04-23 NOTE — Patient Instructions (Addendum)
DUE TO COVID-19 ONLY ONE VISITOR IS ALLOWED TO COME WITH YOU AND STAY IN THE WAITING ROOM ONLY DURING PRE OP AND PROCEDURE DAY OF SURGERY. THE 1 VISITOR  MAY VISIT WITH YOU AFTER SURGERY IN YOUR PRIVATE ROOM DURING VISITING HOURS ONLY!                           [name] [name]             04/20/2020              Your procedure is scheduled on: 05/03/2020               Report to [facility] Main  Entrance    Report to admitting at   115pm               Call this number if you have problems the morning of surgery [phone]               Remember:    Do not eat food , candy gum or mints :After Midnight. You may have clear liquids from midnight until 1215pm    CLEAR LIQUID DIET   Foods Allowed                                                                       Coffee and tea, regular and decaf                              Plain Jell-O any favor except red or purple                                            Fruit ices (not with fruit pulp)                                      Iced Popsicles                                                               Carbonated beverages, regular and diet                                    Cranberry, grape and apple juices Sports drinks like Gatorade Lightly seasoned clear broth or consume(fat free) Sugar, honey syrup   _____________________________________________________________________     BRUSH YOUR TEETH MORNING OF SURGERY AND RINSE YOUR MOUTH OUT, NO CHEWING GUM CANDY OR MINTS.                           Take these medicines the morning of surgery with A SIP OF WATER: amlodipine, methadone  DO NOT TAKE ANY DIABETIC MEDICATIONS DAY OF YOUR SURGERY                               You may not have any metal on your body including hair pins and              piercings  Do not wear jewelry, make-up, lotions, powders or perfumes, deodorant             Do not wear nail polish on your fingernails.  Do not shave  48 hours  prior to surgery.              Men may shave face and neck.              Do not bring valuables to the hospital. [employer] IS NOT             RESPONSIBLE   FOR VALUABLES.             Contacts, dentures or bridgework may not be worn into surgery.             Leave suitcase in the car. After surgery it may be brought to your room.                           Patients discharged the day of surgery will not be allowed to drive home. IF YOU ARE HAVING SURGERY AND GOING HOME THE SAME DAY, YOU MUST HAVE AN ADULT TO DRIVE YOU HOME AND BE WITH YOU FOR 24 HOURS. YOU MAY GO HOME BY TAXI OR UBER OR ORTHERWISE, BUT AN ADULT MUST ACCOMPANY YOU HOME AND STAY WITH YOU FOR 24 HOURS.             Name and phone number of your driver:             Special Instructions: N/A              Please read over the following fact sheets you were given: _____________________________________________________________________  [facility] - Preparing for Surgery Before surgery, you can play an important role.  Because skin is not sterile, your skin needs to be as free of germs as possible.  You can reduce the number of germs on your skin by washing with CHG (chlorahexidine gluconate) soap before surgery.  CHG is an antiseptic cleaner which kills germs and bonds with the skin to continue killing germs even after washing. Please DO NOT use if you have an allergy to CHG or antibacterial soaps.  If your skin becomes reddened/irritated stop using the CHG and inform your nurse when you arrive at Short Stay. Do not shave (including legs and underarms) for at least 48 hours prior to the first CHG shower.  You may shave your face/neck. Please follow these instructions carefully:             1.  Shower with CHG Soap the night before surgery and the  morning of Surgery.             2.  If you choose to wash your hair, wash your hair first as usual with your  normal  shampoo.             3.  After you shampoo, rinse your hair and body  thoroughly to remove the  shampoo.  4.  Use CHG as you would any other liquid soap.  You can apply chg directly  to the skin and wash                       Gently with a scrungie or clean washcloth.             5.  Apply the CHG Soap to your body ONLY FROM THE NECK DOWN.   Do not use on face/ open                           Wound or open sores. Avoid contact with eyes, ears mouth and genitals (private parts).                       Wash face,  Genitals (private parts) with your normal soap.             6.  Wash thoroughly, paying special attention to the area where your surgery  will be performed.             7.  Thoroughly rinse your body with warm water from the neck down.             8.  DO NOT shower/wash with your normal soap after using and rinsing off  the CHG Soap.                9.  Pat yourself dry with a clean towel.            10.  Wear clean pajamas.            11.  Place clean sheets on your bed the night of your first shower and do not  sleep with pets. Day of Surgery : Do not apply any lotions/deodorants the morning of surgery.  Please wear clean clothes to the hospital/surgery center.  FAILURE TO FOLLOW THESE INSTRUCTIONS MAY RESULT IN THE CANCELLATION OF YOUR SURGERY PATIENT SIGNATURE_________________________________  NURSE SIGNATURE__________________________________  ________________________________________________________________________

## 2020-04-30 NOTE — H&P (Signed)
Office Visit Report     04/20/2020   --------------------------------------------------------------------------------   [name] [name]  MRN: [id number]  DOB: Sep 26, 1970, 50 year old Male  SSN:    PRIMARY CARE:    REFERRING:  Sherene Sires, M  PROVIDER:  Heloise Purpura, M.D.  LOCATION:  [facility] [phone]     --------------------------------------------------------------------------------   CC/HPI: Right renal calculus   He returns today after undergoing shockwave lithotripsy for his right renal calculus measuring approximately 8 mm. His procedure was performed on 04/12/20. He presents today with a KUB x-ray and for possible stent removal. He tolerated the procedure quite well. He has some expected urinary symptoms related to his stent but has denied any fever, hematuria, or significant pain symptoms lately.     ALLERGIES: amoxicillian - Hives Lotrel - Swelling    MEDICATIONS: Metformin Hcl  Amlodipine Besilate  Methadone Hcl  Olmesartan Medoxomil  Onglyza  Testosterone     GU PSH: Cystoscopy Insert Stent, Right - 03/31/2020 ESWL, Right - 04/12/2020     NON-GU PSH: Knee Arthroscopy     GU PMH: Renal calculus - 04/08/2020      PMH Notes:   1) Urolithiasis: He presented to me in January 2022 with a right ureteral stone, fever with COVID positive diagnosis, and uncontrolled pain.   Jan 2022: Right ureteral stent  Jan 2022: R ESWL   ** He has a history of chronic pain with chronic narcotic pain therapy through his pain management physician.   NON-GU PMH: Anxiety Diabetes Type 2 GERD Hypercholesterolemia Hypertension Hyperthyroidism Sleep Apnea    FAMILY HISTORY: 1 Daughter - Daughter 1 son - Son Breast Cancer - Mother   SOCIAL HISTORY: Marital Status: Married Current Smoking Status: Patient has never smoked.   Tobacco Use Assessment Completed: Used Tobacco in last 30 days? Has never drank.  Drinks 2 caffeinated drinks per  day. Patient's occupation is/was disabled.    REVIEW OF SYSTEMS:    GU Review Male:   Patient denies frequent urination, hard to postpone urination, burning/ pain with urination, get up at night to urinate, leakage of urine, stream starts and stops, trouble starting your streams, and have to strain to urinate .  Gastrointestinal (Upper):   Patient denies nausea and vomiting.  Gastrointestinal (Lower):   Patient denies constipation and diarrhea.  Constitutional:   Patient denies fever, night sweats, weight loss, and fatigue.  Skin:   Patient denies skin rash/ lesion and itching.  Eyes:   Patient denies blurred vision and double vision.  Ears/ Nose/ Throat:   Patient denies sore throat and sinus problems.  Hematologic/Lymphatic:   Patient denies swollen glands and easy bruising.  Cardiovascular:   Patient denies leg swelling and chest pains.  Respiratory:   Patient denies cough and shortness of breath.  Endocrine:   Patient denies excessive thirst.  Musculoskeletal:   Patient denies back pain and joint pain.  Neurological:   Patient denies headaches and dizziness.  Psychologic:   Patient denies depression and anxiety.   VITAL SIGNS:      04/20/2020 11:00 AM  Weight 195 lb / 88.45 kg  Height 66 in / 167.64 cm  BP 137/75 mmHg  Pulse 118 /min  Temperature 98.7 F / 37.0 C  BMI 31.5 kg/m   MULTI-SYSTEM PHYSICAL EXAMINATION:    Constitutional: Well-nourished. No physical deformities. Normally developed. Good grooming.  Respiratory: No labored breathing, no use of accessory muscles. Clear bilaterally.  Cardiovascular: Normal temperature, normal extremity pulses,  no swelling, no varicosities. Regular rate and rhythm.     Complexity of Data:  X-Ray Review: KUB: Reviewed Films.    Notes:                     I independently reviewed his KUB. This demonstrates his right ureteral stent to be in proper position. The previously noted 8 mm calculus appears to be unchanged compared to his  preoperative x-ray prior to ESWL. This is in a nonobstructing position within the right renal collecting system.   PROCEDURES:         KUB - [id number]  A single view of the abdomen is obtained.      Patient confirmed No Neulasta OnPro Device.           Urinalysis w/Scope - 81001 Dipstick Dipstick Cont'd Micro  Specimen: Voided Bilirubin: Neg WBC/hpf: 6 - 10/hpf  Color: Yellow Ketones: Neg RBC/hpf: Packed/hpf  Appearance: Slightly Cloudy Blood: 3+ Bacteria: Rare (0-9/hpf)  Specific Gravity: 1.020 Protein: 1+ Cystals: NS (Not Seen)  pH: 7.5 Urobilinogen: 0.2 Casts: NS (Not Seen)  Glucose: Neg Nitrites: Neg Trichomonas: Not Present    Leukocyte Esterase: 1+ Mucous: Not Present      Epithelial Cells: NS (Not Seen)      Yeast: NS (Not Seen)      Sperm: Not Present    Notes:      ASSESSMENT:      ICD-10 Details  1 GU:   Renal calculus - N20.0    PLAN:           Orders Labs Urine Culture          Schedule Return Visit/Planned Activity: Other See Visit Notes             Note: Will call to schedule surgery.          Document Letter(s):  Created for Patient: Clinical Summary         Notes:   1. Right renal calculus: Unfortunately, his renal calculus is completely unchanged compared to his KUB prior to ESWL. As such, we reviewed options and did discuss proceeding with ureteroscopic laser lithotripsy for definitive treatment versus removal of the stent as an alternative option although he would understand that he could present with recurrent obstruction the future if his stone did migrate back into the ureter. Ultimately, he does wish to proceed with definitive treatment and will be scheduled for cystoscopy with right ureteroscopy and laser lithotripsy, stone removal, and right ureteral stent placement. We have reviewed the potential risks, complications, and expected recovery process. He gives informed consent to proceed. This will be scheduled for the near future.    * Signed by  Heloise Purpura, M.D. on 04/20/20 at 11:38 AM (EST)*

## 2020-05-03 ENCOUNTER — Encounter (HOSPITAL_COMMUNITY)
Admission: RE | Disposition: A | Discharge status: Home / Self Care | Payer: Self-pay | Source: Ambulatory Visit | Attending: Urology

## 2020-05-03 ENCOUNTER — Ambulatory Visit (HOSPITAL_COMMUNITY): Payer: Medicare Other | Admitting: Certified Registered Nurse Anesthetist

## 2020-05-03 ENCOUNTER — Ambulatory Visit (HOSPITAL_COMMUNITY): Payer: Medicare Other

## 2020-05-03 ENCOUNTER — Encounter (HOSPITAL_COMMUNITY): Payer: Self-pay | Admitting: Urology

## 2020-05-03 ENCOUNTER — Ambulatory Visit (HOSPITAL_COMMUNITY)
Admission: RE | Admit: 2020-05-03 | Discharge: 2020-05-03 | Disposition: A | Discharge status: Home / Self Care | Payer: Medicare Other | Source: Ambulatory Visit | Attending: Urology | Admitting: Urology

## 2020-05-03 DIAGNOSIS — E119 Type 2 diabetes mellitus without complications: Secondary | ICD-10-CM | POA: Insufficient documentation

## 2020-05-03 DIAGNOSIS — Z888 Allergy status to other drugs, medicaments and biological substances status: Secondary | ICD-10-CM | POA: Diagnosis not present

## 2020-05-03 DIAGNOSIS — N200 Calculus of kidney: Secondary | ICD-10-CM | POA: Insufficient documentation

## 2020-05-03 DIAGNOSIS — Z880 Allergy status to penicillin: Secondary | ICD-10-CM | POA: Diagnosis not present

## 2020-05-03 HISTORY — PX: CYSTOSCOPY/URETEROSCOPY/HOLMIUM LASER/STENT PLACEMENT: SHX6546

## 2020-05-03 LAB — GLUCOSE, CAPILLARY: Glucose-Capillary: 200 mg/dL — ABNORMAL HIGH (ref 70–99)

## 2020-05-03 LAB — BASIC METABOLIC PANEL
Anion gap: 12 (ref 5–15)
BUN: 15 mg/dL (ref 6–20)
CO2: 29 mmol/L (ref 22–32)
Calcium: 9.3 mg/dL (ref 8.9–10.3)
Chloride: 95 mmol/L — ABNORMAL LOW (ref 98–111)
Creatinine, Ser: 0.83 mg/dL (ref 0.61–1.24)
GFR, Estimated: >60 mL/min (ref >60)
Glucose, Bld: 204 mg/dL — ABNORMAL HIGH (ref 70–99)
Potassium: 3.4 mmol/L — ABNORMAL LOW (ref 3.5–5.1)
Sodium: 136 mmol/L (ref 135–145)

## 2020-05-03 SURGERY — CYSTOSCOPY/URETEROSCOPY/HOLMIUM LASER/STENT PLACEMENT
Anesthesia: General | Laterality: Right

## 2020-05-03 MED ORDER — ONDANSETRON HCL 4 MG/2ML IJ SOLN
INTRAMUSCULAR | Status: DC | PRN
  Administered 2020-05-03: 4 mg via INTRAVENOUS

## 2020-05-03 MED ORDER — CEFAZOLIN SODIUM-DEXTROSE 2-4 GM/100ML-% IV SOLN
2.0000 g | Freq: Once | INTRAVENOUS | Status: AC
  Administered 2020-05-03: 2 g via INTRAVENOUS

## 2020-05-03 MED ORDER — DEXAMETHASONE SODIUM PHOSPHATE 10 MG/ML IJ SOLN
INTRAMUSCULAR | Status: DC | PRN
  Administered 2020-05-03: 5 mg via INTRAVENOUS

## 2020-05-03 MED ORDER — PROPOFOL 10 MG/ML IV BOLUS
INTRAVENOUS | Status: DC | PRN
  Administered 2020-05-03: 200 mg via INTRAVENOUS

## 2020-05-03 MED ORDER — DIPHENHYDRAMINE HCL 50 MG/ML IJ SOLN
INTRAMUSCULAR | Status: DC | PRN
  Administered 2020-05-03: 25 mg via INTRAVENOUS

## 2020-05-03 MED ORDER — HYDROMORPHONE HCL 1 MG/ML IJ SOLN: 0.2500 mg | INTRAMUSCULAR | Status: DC | PRN

## 2020-05-03 MED ORDER — FENTANYL CITRATE (PF) 100 MCG/2ML IJ SOLN
INTRAMUSCULAR | Status: AC
  Filled 2020-05-03: qty 2

## 2020-05-03 MED ORDER — 0.9 % SODIUM CHLORIDE (POUR BTL) OPTIME
TOPICAL | Status: DC | PRN
  Administered 2020-05-03: 1000 mL

## 2020-05-03 MED ORDER — PHENYLEPHRINE 40 MCG/ML (10ML) SYRINGE FOR IV PUSH (FOR BLOOD PRESSURE SUPPORT)
PREFILLED_SYRINGE | INTRAVENOUS | Status: AC
  Filled 2020-05-03: qty 10

## 2020-05-03 MED ORDER — LIDOCAINE 2% (20 MG/ML) 5 ML SYRINGE
INTRAMUSCULAR | Status: DC | PRN
  Administered 2020-05-03: 60 mg via INTRAVENOUS

## 2020-05-03 MED ORDER — LACTATED RINGERS IV SOLN: INTRAVENOUS | Status: DC

## 2020-05-03 MED ORDER — MIDAZOLAM HCL 5 MG/5ML IJ SOLN
INTRAMUSCULAR | Status: DC | PRN
  Administered 2020-05-03: 2 mg via INTRAVENOUS

## 2020-05-03 MED ORDER — PROMETHAZINE HCL 25 MG/ML IJ SOLN
6.2500 mg | INTRAMUSCULAR | Status: DC | PRN
Start: 2020-05-03 — End: 2020-05-04

## 2020-05-03 MED ORDER — SODIUM CHLORIDE 0.9 % IR SOLN
Status: DC | PRN
  Administered 2020-05-03: 6000 mL

## 2020-05-03 MED ORDER — AMISULPRIDE (ANTIEMETIC) 5 MG/2ML IV SOLN: 10.0000 mg | Freq: Once | INTRAVENOUS | Status: DC | PRN

## 2020-05-03 MED ORDER — MIDAZOLAM HCL 2 MG/2ML IJ SOLN
INTRAMUSCULAR | Status: AC
  Filled 2020-05-03: qty 2

## 2020-05-03 MED ORDER — DEXAMETHASONE SODIUM PHOSPHATE 10 MG/ML IJ SOLN
INTRAMUSCULAR | Status: AC
  Filled 2020-05-03: qty 1

## 2020-05-03 MED ORDER — CHLORHEXIDINE GLUCONATE 0.12 % MT SOLN
15.0000 mL | Freq: Once | OROMUCOSAL | Status: AC
  Administered 2020-05-03: 15 mL via OROMUCOSAL

## 2020-05-03 MED ORDER — ORAL CARE MOUTH RINSE: 15.0000 mL | Freq: Once | OROMUCOSAL | Status: AC

## 2020-05-03 MED ORDER — ONDANSETRON HCL 4 MG/2ML IJ SOLN
INTRAMUSCULAR | Status: AC
  Filled 2020-05-03: qty 2

## 2020-05-03 MED ORDER — PHENYLEPHRINE 40 MCG/ML (10ML) SYRINGE FOR IV PUSH (FOR BLOOD PRESSURE SUPPORT)
PREFILLED_SYRINGE | INTRAVENOUS | Status: DC | PRN
  Administered 2020-05-03 (×2): 160 ug via INTRAVENOUS

## 2020-05-03 MED ORDER — OXYCODONE HCL 5 MG/5ML PO SOLN: 5.0000 mg | Freq: Once | ORAL | Status: DC | PRN

## 2020-05-03 MED ORDER — OXYCODONE HCL 5 MG PO TABS: 5.0000 mg | ORAL_TABLET | Freq: Once | ORAL | Status: DC | PRN

## 2020-05-03 MED ORDER — CEFAZOLIN SODIUM-DEXTROSE 2-4 GM/100ML-% IV SOLN
INTRAVENOUS | Status: AC
  Filled 2020-05-03: qty 100

## 2020-05-03 SURGICAL SUPPLY — 20 items
BAG URO CATCHER STRL LF (MISCELLANEOUS) ×2 IMPLANT
BASKET ZERO TIP NITINOL 2.4FR (BASKET) IMPLANT
CATH INTERMIT  6FR 70CM (CATHETERS) ×2 IMPLANT
CLOTH BEACON ORANGE TIMEOUT ST (SAFETY) ×2 IMPLANT
GLOVE SURG ENC TEXT LTX SZ7.5 (GLOVE) ×2 IMPLANT
GOWN STRL REUS W/TWL LRG LVL3 (GOWN DISPOSABLE) ×2 IMPLANT
GUIDEWIRE STR DUAL SENSOR (WIRE) ×2 IMPLANT
GUIDEWIRE ZIPWRE .038 STRAIGHT (WIRE) IMPLANT
IV NS 1000ML (IV SOLUTION) ×2
IV NS 1000ML BAXH (IV SOLUTION) ×1 IMPLANT
KIT TURNOVER KIT A (KITS) IMPLANT
LASER FIB FLEXIVA PULSE ID 365 (Laser) IMPLANT
MANIFOLD NEPTUNE II (INSTRUMENTS) ×2 IMPLANT
PACK CYSTO (CUSTOM PROCEDURE TRAY) ×2 IMPLANT
SHEATH URETERAL 12FRX35CM (MISCELLANEOUS) ×2 IMPLANT
STENT URET 6FRX24 CONTOUR (STENTS) ×2 IMPLANT
TRACTIP FLEXIVA PULS ID 200XHI (Laser) ×1 IMPLANT
TRACTIP FLEXIVA PULSE ID 200 (Laser) ×2
TUBING CONNECTING 10 (TUBING) ×2 IMPLANT
TUBING UROLOGY SET (TUBING) ×2 IMPLANT

## 2020-05-03 NOTE — Anesthesia Preprocedure Evaluation (Signed)
Anesthesia Evaluation  Patient identified by MRN, date of birth, ID band Patient awake    Reviewed: Allergy & Precautions, H&P , NPO status , Patient's Chart, lab work & pertinent test results  Airway Mallampati: I  TM Distance: >3 FB Neck ROM: Full    Dental no notable dental hx. (+) Teeth Intact, Dental Advisory Given   Pulmonary sleep apnea ,    Pulmonary exam normal breath sounds clear to auscultation       Cardiovascular hypertension, Pt. on medications  Rhythm:Regular Rate:Normal     Neuro/Psych  Headaches, Depression    GI/Hepatic negative GI ROS, Neg liver ROS,   Endo/Other  diabetes, Type 2, Oral Hypoglycemic AgentsHypothyroidism   Renal/GU negative Renal ROS  negative genitourinary   Musculoskeletal  (+) Arthritis , Osteoarthritis,    Abdominal (+) + obese,   Peds  Hematology negative hematology ROS (+)   Anesthesia Other Findings   Reproductive/Obstetrics negative OB ROS                             Anesthesia Physical  Anesthesia Plan  ASA: III  Anesthesia Plan: General   Post-op Pain Management:    Induction: Intravenous  PONV Risk Score and Plan: 2 and Ondansetron, Midazolam and Treatment may vary due to age or medical condition  Airway Management Planned: LMA  Additional Equipment:   Intra-op Plan:   Post-operative Plan: Extubation in OR  Informed Consent: I have reviewed the patients History and Physical, chart, labs and discussed the procedure including the risks, benefits and alternatives for the proposed anesthesia with the patient or authorized representative who has indicated his/her understanding and acceptance.     Dental advisory given  Plan Discussed with: CRNA  Anesthesia Plan Comments:         Anesthesia Quick Evaluation

## 2020-05-03 NOTE — Discharge Instructions (Signed)
1. You may see some blood in the urine and may have some burning with urination for 48-72 hours. You also may notice that you have to urinate more frequently or urgently after your procedure which is normal.  2. You should call should you develop an inability urinate, fever > 101, persistent nausea and vomiting that prevents you from eating or drinking to stay hydrated.  3. If you have a stent, you will likely urinate more frequently and urgently until the stent is removed and you may experience some discomfort/pain in the lower abdomen and flank especially when urinating. You may take pain medication prescribed to you if needed for pain. You may also intermittently have blood in the urine until the stent is removed.  You may remove your stent on FRIDAY morning.  Simply pull the string that is taped to your body and the stent will easily come out.  This may be best done in the shower as some urine may come out with the stent.  Usually you will feel relief once the stent is removed, but occasionally patients can develop pain due to residual swelling of the ureter that may temporarily obstruct the kidney.  This can be managed by taking pain medication and it will typically resolve with time.  Please do not hesitate to call if you have pain that is not controlled with your pain medication or does not improved within 24-48 hours.

## 2020-05-03 NOTE — Anesthesia Postprocedure Evaluation (Signed)
Anesthesia Post Note  Patient: [name] [name]  Procedure(s) Performed: CYSTOSCOPY/URETEROSCOPY/HOLMIUM LASER/STENT PLACEMENT (Right )     Patient location during evaluation: PACU Anesthesia Type: General Level of consciousness: awake and alert Pain management: pain level controlled Vital Signs Assessment: post-procedure vital signs reviewed and stable Respiratory status: spontaneous breathing, nonlabored ventilation and respiratory function stable Cardiovascular status: blood pressure returned to baseline and stable Postop Assessment: no apparent nausea or vomiting Anesthetic complications: no   No complications documented.  Last Vitals:  Vitals:   05/03/20 1641 05/03/20 1700  BP: 94/61 100/63  Pulse: 70 68  Resp: (!) 6 (!) 7  Temp: 36.9 C   SpO2: 99% 99%    Last Pain:  Vitals:   05/03/20 1700  PainSc: Asleep                 Lowella Curb

## 2020-05-03 NOTE — Transfer of Care (Signed)
Immediate Anesthesia Transfer of Care Note  Patient: [name] [name]  Procedure(s) Performed: CYSTOSCOPY/URETEROSCOPY/HOLMIUM LASER/STENT PLACEMENT (Right )  Patient Location: PACU  Anesthesia Type:General  Level of Consciousness: awake, alert  and oriented  Airway & Oxygen Therapy: Patient Spontanous Breathing and Patient connected to face mask  Post-op Assessment: Report given to RN and Post -op Vital signs reviewed and stable  Post vital signs: Reviewed and stable  Last Vitals:  Vitals Value Taken Time  BP 94/61 05/03/20 1641  Temp    Pulse 71 05/03/20 1644  Resp 7 05/03/20 1644  SpO2 99 % 05/03/20 1644  Vitals shown include unvalidated device data.  Last Pain:  Vitals:   05/03/20 1320  PainSc: 0-No pain         Complications: No complications documented.

## 2020-05-03 NOTE — OR Nursing (Signed)
Stone taken by Dr. Borden 

## 2020-05-03 NOTE — Interval H&P Note (Signed)
History and Physical Interval Note:  05/03/2020 3:14 PM  [name] [name]  has presented today for surgery, with the diagnosis of RIGHT RENAL CALCULUS.  The various methods of treatment have been discussed with the patient and family. After consideration of risks, benefits and other options for treatment, the patient has consented to  Procedure(s): CYSTOSCOPY/URETEROSCOPY/HOLMIUM LASER/STENT PLACEMENT (Right) as a surgical intervention.  The patient's history has been reviewed, patient examined, no change in status, stable for surgery.  I have reviewed the patient's chart and labs.  Questions were answered to the patient's satisfaction.     [employer]

## 2020-05-03 NOTE — Anesthesia Procedure Notes (Signed)
Procedure Name: LMA Insertion Performed by: Sudie Grumbling, CRNA Pre-anesthesia Checklist: Patient identified, Emergency Drugs available and Suction available Patient Re-evaluated:Patient Re-evaluated prior to induction Oxygen Delivery Method: Circle system utilized Preoxygenation: Pre-oxygenation with 100% oxygen Induction Type: IV induction LMA: LMA with gastric port inserted LMA Size: 4.0 Tube type: Oral Number of attempts: 1 Placement Confirmation: positive ETCO2 and breath sounds checked- equal and bilateral Tube secured with: Tape Dental Injury: Teeth and Oropharynx as per pre-operative assessment

## 2020-05-03 NOTE — Interval H&P Note (Signed)
History and Physical Interval Note:  05/03/2020 3:16 PM  [name] [name]  has presented today for surgery, with the diagnosis of RIGHT RENAL CALCULUS.  The various methods of treatment have been discussed with the patient and family. After consideration of risks, benefits and other options for treatment, the patient has consented to  Procedure(s): CYSTOSCOPY/URETEROSCOPY/HOLMIUM LASER/STENT PLACEMENT (Right) as a surgical intervention.  The patient's history has been reviewed, patient examined, no change in status, stable for surgery.  I have reviewed the patient's chart and labs.  Questions were answered to the patient's satisfaction.     [employer]

## 2020-05-03 NOTE — Op Note (Signed)
Preoperative diagnosis: Right renal calculus  Postoperative diagnosis: Right renal calculus  Procedure:  1. Cystoscopy 2. Right ureteroscopy and stone removal 3. Ureteroscopic laser lithotripsy 4. Right ureteral stent placement (6 x 24 with string)  Surgeon: Rolly Salter, Montez Hageman. M.D.  Anesthesia: General  Complications: None  EBL: Minimal  Specimens: 1. Right renal calculus  Disposition of specimens: [facility] for stone analysis  Indication: [name] [name]  is a 50 y.o. patient with urolithiasis.  He recently presented with an obstructing right renal calculus requiring stent placement for pain control.  He elected ESWL for therapy but his stone did not fragment well causing him to now present to proceed with definitive ureteroscopic management. After reviewing the management options for treatment, they elected to proceed with the above surgical procedure(s). We have discussed the potential benefits and risks of the procedure, side effects of the proposed treatment, the likelihood of the patient achieving the goals of the procedure, and any potential problems that might occur during the procedure or recuperation. Informed consent has been obtained.  Description of procedure:  The patient was taken to the operating room and general anesthesia was induced.  The patient was placed in the dorsal lithotomy position, prepped and draped in the usual sterile fashion, and preoperative antibiotics were administered. A preoperative time-out was performed.   Cystourethroscopy was performed.  The patient's urethra was examined and was normal. The bladder was then systematically examined in its entirety. There was no evidence for any bladder tumors, stones, or other mucosal pathology.    Attention then turned to the right ureteral orifice and the indwelling right ureteral stent was brought to the urethral meatus with flexible graspers.  A 0.38 sensor guidewire was then advanced  up the right ureter into the renal pelvis under fluoroscopic guidance.  A 12/14 Fr ureteral access sheath was then advanced over the guide wire. The digital flexible ureteroscope was then advanced through the access sheath into the ureter next to the guidewire and the calculus was identified and was located in an upper pole calyx.   The stone was then fragmented with the 200 micron holmium laser fiber on a setting of 0.6 J and frequency of 6 Hz.   All sizable stones were then removed with a zero tip nitinol basket.  Reinspection of the ureter/renal pelvis revealed no remaining visible stones or fragments of significant size.   The safety wire was then replaced and the access sheath removed.  The guidewire was backloaded through the cystoscope and a ureteral stent was advance over the wire using Seldinger technique.  The stent was positioned appropriately under fluoroscopic and cystoscopic guidance.  The wire was then removed with an adequate stent curl noted in the renal pelvis as well as in the bladder.  The bladder was then emptied and the procedure ended.  The patient appeared to tolerate the procedure well and without complications.  The patient was able to be awakened and transferred to the recovery unit in satisfactory condition.   Moody Bruins MD

## 2020-05-04 ENCOUNTER — Encounter (HOSPITAL_COMMUNITY): Payer: Self-pay | Admitting: Urology

## 2021-05-13 ENCOUNTER — Other Ambulatory Visit: Payer: Self-pay | Admitting: Internal Medicine

## 2021-05-14 LAB — URINE CULTURE
MICRO NUMBER:: 13024448
Result:: NO GROWTH
SPECIMEN QUALITY:: ADEQUATE

## 2021-10-13 ENCOUNTER — Other Ambulatory Visit: Payer: Self-pay | Admitting: Internal Medicine

## 2021-10-18 LAB — CBC
HCT: 48.1 % (ref 38.5–50.0)
Hemoglobin: 16.2 g/dL (ref 13.2–17.1)
MCH: 29.5 pg (ref 27.0–33.0)
MCHC: 33.7 g/dL (ref 32.0–36.0)
MCV: 87.6 fL (ref 80.0–100.0)
MPV: 11.3 fL (ref 7.5–12.5)
Platelets: 221 10*3/uL (ref 140–400)
RBC: 5.49 10*6/uL (ref 4.20–5.80)
RDW: 13.3 % (ref 11.0–15.0)
WBC: 8.2 10*3/uL (ref 3.8–10.8)

## 2021-10-18 LAB — PSA: PSA: 0.46 ng/mL (ref <=4.00)

## 2021-10-18 LAB — TSH: TSH: 1.8 mIU/L (ref 0.40–4.50)

## 2021-10-18 LAB — LIPID PANEL
Cholesterol: 337 mg/dL — ABNORMAL HIGH (ref <200)
HDL: 31 mg/dL — ABNORMAL LOW (ref >=40)
Non-HDL Cholesterol (Calc): 306 mg/dL (calc) — ABNORMAL HIGH (ref <130)
Total CHOL/HDL Ratio: 10.9 (calc) — ABNORMAL HIGH (ref <5.0)
Triglycerides: 1885 mg/dL — ABNORMAL HIGH (ref <150)

## 2021-10-18 LAB — COMPLETE METABOLIC PANEL WITH GFR
AG Ratio: 1.5 (calc) (ref 1.0–2.5)
ALT: 63 U/L — ABNORMAL HIGH (ref 9–46)
AST: 33 U/L (ref 10–35)
Albumin: 4.8 g/dL (ref 3.6–5.1)
Alkaline phosphatase (APISO): 100 U/L (ref 35–144)
BUN: 13 mg/dL (ref 7–25)
CO2: 26 mmol/L (ref 20–32)
Calcium: 9.8 mg/dL (ref 8.6–10.3)
Chloride: 97 mmol/L — ABNORMAL LOW (ref 98–110)
Creat: 0.84 mg/dL (ref 0.70–1.30)
Globulin: 3.1 g/dL (calc) (ref 1.9–3.7)
Glucose, Bld: 117 mg/dL — ABNORMAL HIGH (ref 65–99)
Potassium: 3.6 mmol/L (ref 3.5–5.3)
Sodium: 138 mmol/L (ref 135–146)
Total Bilirubin: 0.8 mg/dL (ref 0.2–1.2)
Total Protein: 7.9 g/dL (ref 6.1–8.1)
eGFR: 106 mL/min/{1.73_m2} (ref >=60)

## 2021-10-18 LAB — TESTOSTERONE, FREE & TOTAL
Free Testosterone: 28.8 pg/mL — ABNORMAL LOW (ref 35.0–155.0)
Testosterone, Total, LC-MS-MS: 123 ng/dL — ABNORMAL LOW (ref 250–1100)

## 2021-10-18 LAB — VITAMIN D 25 HYDROXY (VIT D DEFICIENCY, FRACTURES): Vit D, 25-Hydroxy: 42 ng/mL (ref 30–100)

## 2022-09-21 ENCOUNTER — Other Ambulatory Visit: Payer: Self-pay | Admitting: Internal Medicine

## 2022-09-22 LAB — COMPLETE METABOLIC PANEL WITH GFR
AG Ratio: 1.5 (calc) (ref 1.0–2.5)
ALT: 29 U/L (ref 9–46)
AST: 37 U/L — ABNORMAL HIGH (ref 10–35)
Albumin: 4.3 g/dL (ref 3.6–5.1)
Alkaline phosphatase (APISO): 60 U/L (ref 35–144)
BUN: 21 mg/dL (ref 7–25)
CO2: 25 mmol/L (ref 20–32)
Calcium: 9.7 mg/dL (ref 8.6–10.3)
Chloride: 94 mmol/L — ABNORMAL LOW (ref 98–110)
Creat: 1.1 mg/dL (ref 0.70–1.30)
Globulin: 2.8 g/dL (calc) (ref 1.9–3.7)
Glucose, Bld: 131 mg/dL — ABNORMAL HIGH (ref 65–99)
Potassium: 4.3 mmol/L (ref 3.5–5.3)
Sodium: 136 mmol/L (ref 135–146)
Total Bilirubin: 0.5 mg/dL (ref 0.2–1.2)
Total Protein: 7.1 g/dL (ref 6.1–8.1)
eGFR: 81 mL/min/{1.73_m2} (ref >=60)

## 2022-09-22 LAB — CBC
HCT: 50.7 % — ABNORMAL HIGH (ref 38.5–50.0)
Hemoglobin: 16.5 g/dL (ref 13.2–17.1)
MCH: 27.3 pg (ref 27.0–33.0)
MCHC: 32.5 g/dL (ref 32.0–36.0)
MCV: 83.8 fL (ref 80.0–100.0)
MPV: 11.5 fL (ref 7.5–12.5)
Platelets: 340 10*3/uL (ref 140–400)
RBC: 6.05 10*6/uL — ABNORMAL HIGH (ref 4.20–5.80)
RDW: 13.6 % (ref 11.0–15.0)
WBC: 10.8 10*3/uL (ref 3.8–10.8)

## 2022-09-22 LAB — LIPID PANEL
Cholesterol: 284 mg/dL — ABNORMAL HIGH (ref <200)
HDL: 13 mg/dL — ABNORMAL LOW (ref >=40)
LDL Cholesterol (Calc): 205 mg/dL (calc) — ABNORMAL HIGH
Non-HDL Cholesterol (Calc): 271 mg/dL (calc) — ABNORMAL HIGH (ref <130)
Total CHOL/HDL Ratio: 21.8 (calc) — ABNORMAL HIGH (ref <5.0)
Triglycerides: 391 mg/dL — ABNORMAL HIGH (ref <150)

## 2022-09-22 LAB — FOLATE: Folate: 19.8 ng/mL

## 2022-09-22 LAB — VITAMIN B12: Vitamin B-12: 468 pg/mL (ref 200–1100)

## 2022-09-22 LAB — VITAMIN D 25 HYDROXY (VIT D DEFICIENCY, FRACTURES): Vit D, 25-Hydroxy: 53 ng/mL (ref 30–100)

## 2022-09-22 LAB — TSH: TSH: 1.84 mIU/L (ref 0.40–4.50)

## 2022-09-22 LAB — PSA: PSA: 1.32 ng/mL (ref <=4.00)

## 2022-10-23 ENCOUNTER — Encounter: Payer: Self-pay | Admitting: Internal Medicine

## 2022-11-15 ENCOUNTER — Ambulatory Visit (AMBULATORY_SURGERY_CENTER): Payer: Medicare Other

## 2022-11-15 ENCOUNTER — Encounter: Payer: Self-pay | Admitting: Internal Medicine

## 2022-11-15 VITALS — Ht 66.0 in | Wt 190.0 lb

## 2022-11-15 DIAGNOSIS — Z1211 Encounter for screening for malignant neoplasm of colon: Secondary | ICD-10-CM

## 2022-11-15 MED ORDER — PEG 3350-KCL-NA BICARB-NACL 420 G PO SOLR: 4000.0000 mL | Freq: Once | ORAL | 0 refills | Status: AC

## 2022-11-15 NOTE — Progress Notes (Signed)
No egg or soy allergy known to patient  No issues known to pt with past sedation with any surgeries or procedures Patient has bradycardia when sleeping  Patient denies ever being told they had issues or difficulty with intubation  No FH of Malignant Hyperthermia Pt is not on diet pills Pt is not on  home 02  Pt is not on blood thinners  Pt denies issues with constipation  No A fib or A flutter Have any cardiac testing pending--no  LOA: independent  Prep: golytely   Patient's chart reviewed by Cathlyn Parsons CNRA prior to previsit and patient appropriate for the [facility].  Previsit completed and red dot placed by patient's name on their procedure day (on provider's schedule).     PV competed with patient. Prep instructions sent via mychart and home address.

## 2022-12-06 ENCOUNTER — Encounter: Payer: Medicare Other | Admitting: Internal Medicine
